# Patient Record
Sex: Female | Born: 1975 | Hispanic: No | Marital: Married | Smoking: Never smoker
Health system: Southern US, Community
[De-identification: ages and names within clinical notes are randomized; demographics above are authoritative.]

## PROBLEM LIST (undated history)

## (undated) DIAGNOSIS — E785 Hyperlipidemia, unspecified: Secondary | ICD-10-CM

## (undated) HISTORY — PX: NO PAST SURGERIES: SHX2092

---

## 1998-05-08 ENCOUNTER — Other Ambulatory Visit: Admission: RE | Admit: 1998-05-08 | Discharge: 1998-05-08 | Payer: Self-pay | Admitting: Obstetrics and Gynecology

## 2016-07-27 ENCOUNTER — Emergency Department (HOSPITAL_COMMUNITY): Payer: BLUE CROSS/BLUE SHIELD

## 2016-07-27 ENCOUNTER — Encounter: Payer: Self-pay | Admitting: Emergency Medicine

## 2016-07-27 ENCOUNTER — Emergency Department (HOSPITAL_COMMUNITY)
Admission: EM | Admit: 2016-07-27 | Discharge: 2016-07-27 | Disposition: A | Discharge status: Home / Self Care | Payer: BLUE CROSS/BLUE SHIELD | Attending: Emergency Medicine | Admitting: Emergency Medicine

## 2016-07-27 DIAGNOSIS — S8992XA Unspecified injury of left lower leg, initial encounter: Secondary | ICD-10-CM | POA: Diagnosis not present

## 2016-07-27 DIAGNOSIS — R079 Chest pain, unspecified: Secondary | ICD-10-CM | POA: Diagnosis not present

## 2016-07-27 DIAGNOSIS — Y9389 Activity, other specified: Secondary | ICD-10-CM | POA: Diagnosis not present

## 2016-07-27 DIAGNOSIS — Y999 Unspecified external cause status: Secondary | ICD-10-CM | POA: Insufficient documentation

## 2016-07-27 DIAGNOSIS — T148XXA Other injury of unspecified body region, initial encounter: Secondary | ICD-10-CM | POA: Diagnosis not present

## 2016-07-27 DIAGNOSIS — M25572 Pain in left ankle and joints of left foot: Secondary | ICD-10-CM | POA: Diagnosis not present

## 2016-07-27 DIAGNOSIS — Y92410 Unspecified street and highway as the place of occurrence of the external cause: Secondary | ICD-10-CM | POA: Insufficient documentation

## 2016-07-27 DIAGNOSIS — M25562 Pain in left knee: Secondary | ICD-10-CM | POA: Insufficient documentation

## 2016-07-27 DIAGNOSIS — S99912A Unspecified injury of left ankle, initial encounter: Secondary | ICD-10-CM | POA: Diagnosis not present

## 2016-07-27 MED ORDER — CYCLOBENZAPRINE HCL 10 MG PO TABS: 10.0000 mg | ORAL_TABLET | Freq: Three times a day (TID) | ORAL | 0 refills | Status: DC | PRN

## 2016-07-27 MED ORDER — IBUPROFEN 800 MG PO TABS: 800.0000 mg | ORAL_TABLET | Freq: Three times a day (TID) | ORAL | 0 refills | Status: DC

## 2016-07-27 MED ORDER — IBUPROFEN 800 MG PO TABS
800.0000 mg | ORAL_TABLET | Freq: Once | ORAL | Status: AC
  Administered 2016-07-27: 800 mg via ORAL
  Filled 2016-07-27: qty 1
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
## 2016-07-27 NOTE — Discharge Instructions (Signed)
Elevate and apply ice packs on/off to your ankle and knee.  Minimal walking or standing for few days.  Call the orthopedic doctor listed to arrange a follow-up appt in one week if not improving

## 2016-07-28 DIAGNOSIS — Z1231 Encounter for screening mammogram for malignant neoplasm of breast: Secondary | ICD-10-CM | POA: Diagnosis not present

## 2016-07-28 DIAGNOSIS — Z6829 Body mass index (BMI) 29.0-29.9, adult: Secondary | ICD-10-CM | POA: Diagnosis not present

## 2016-07-28 DIAGNOSIS — Z01419 Encounter for gynecological examination (general) (routine) without abnormal findings: Secondary | ICD-10-CM | POA: Diagnosis not present

## 2016-08-07 DIAGNOSIS — Z0000 Encounter for general adult medical examination without abnormal findings: Secondary | ICD-10-CM | POA: Diagnosis not present

## 2016-08-07 DIAGNOSIS — Z1389 Encounter for screening for other disorder: Secondary | ICD-10-CM | POA: Diagnosis not present

## 2016-08-07 DIAGNOSIS — Z130 Encounter for screening for diseases of the blood and blood-forming organs and certain disorders involving the immune mechanism: Secondary | ICD-10-CM | POA: Diagnosis not present

## 2016-08-07 DIAGNOSIS — Z1329 Encounter for screening for other suspected endocrine disorder: Secondary | ICD-10-CM | POA: Diagnosis not present

## 2016-08-07 DIAGNOSIS — Z13220 Encounter for screening for lipoid disorders: Secondary | ICD-10-CM | POA: Diagnosis not present

## 2016-08-07 DIAGNOSIS — N911 Secondary amenorrhea: Secondary | ICD-10-CM | POA: Diagnosis not present

## 2017-08-09 DIAGNOSIS — Z1151 Encounter for screening for human papillomavirus (HPV): Secondary | ICD-10-CM | POA: Diagnosis not present

## 2017-08-09 DIAGNOSIS — Z1231 Encounter for screening mammogram for malignant neoplasm of breast: Secondary | ICD-10-CM | POA: Diagnosis not present

## 2017-08-09 DIAGNOSIS — Z01419 Encounter for gynecological examination (general) (routine) without abnormal findings: Secondary | ICD-10-CM | POA: Diagnosis not present

## 2017-08-09 DIAGNOSIS — Z6828 Body mass index (BMI) 28.0-28.9, adult: Secondary | ICD-10-CM | POA: Diagnosis not present

## 2017-08-27 DIAGNOSIS — Z1389 Encounter for screening for other disorder: Secondary | ICD-10-CM | POA: Diagnosis not present

## 2017-08-27 DIAGNOSIS — Z0000 Encounter for general adult medical examination without abnormal findings: Secondary | ICD-10-CM | POA: Diagnosis not present

## 2017-08-27 DIAGNOSIS — Z130 Encounter for screening for diseases of the blood and blood-forming organs and certain disorders involving the immune mechanism: Secondary | ICD-10-CM | POA: Diagnosis not present

## 2017-08-27 DIAGNOSIS — Z13220 Encounter for screening for lipoid disorders: Secondary | ICD-10-CM | POA: Diagnosis not present

## 2017-11-05 DIAGNOSIS — L218 Other seborrheic dermatitis: Secondary | ICD-10-CM | POA: Diagnosis not present

## 2018-09-13 DIAGNOSIS — Z3041 Encounter for surveillance of contraceptive pills: Secondary | ICD-10-CM | POA: Diagnosis not present

## 2018-09-13 DIAGNOSIS — Z1151 Encounter for screening for human papillomavirus (HPV): Secondary | ICD-10-CM | POA: Diagnosis not present

## 2018-09-13 DIAGNOSIS — Z01419 Encounter for gynecological examination (general) (routine) without abnormal findings: Secondary | ICD-10-CM | POA: Diagnosis not present

## 2018-09-13 DIAGNOSIS — Z1231 Encounter for screening mammogram for malignant neoplasm of breast: Secondary | ICD-10-CM | POA: Diagnosis not present

## 2018-09-13 DIAGNOSIS — Z6829 Body mass index (BMI) 29.0-29.9, adult: Secondary | ICD-10-CM | POA: Diagnosis not present

## 2018-09-20 DIAGNOSIS — E559 Vitamin D deficiency, unspecified: Secondary | ICD-10-CM | POA: Diagnosis not present

## 2018-09-20 DIAGNOSIS — Z130 Encounter for screening for diseases of the blood and blood-forming organs and certain disorders involving the immune mechanism: Secondary | ICD-10-CM | POA: Diagnosis not present

## 2018-09-20 DIAGNOSIS — Z13220 Encounter for screening for lipoid disorders: Secondary | ICD-10-CM | POA: Diagnosis not present

## 2018-09-20 DIAGNOSIS — Z1329 Encounter for screening for other suspected endocrine disorder: Secondary | ICD-10-CM | POA: Diagnosis not present

## 2018-09-20 DIAGNOSIS — Z0000 Encounter for general adult medical examination without abnormal findings: Secondary | ICD-10-CM | POA: Diagnosis not present

## 2018-09-23 DIAGNOSIS — Z30432 Encounter for removal of intrauterine contraceptive device: Secondary | ICD-10-CM | POA: Diagnosis not present

## 2018-12-23 DIAGNOSIS — D649 Anemia, unspecified: Secondary | ICD-10-CM | POA: Diagnosis not present

## 2019-03-03 DIAGNOSIS — L818 Other specified disorders of pigmentation: Secondary | ICD-10-CM | POA: Diagnosis not present

## 2019-04-18 DIAGNOSIS — L219 Seborrheic dermatitis, unspecified: Secondary | ICD-10-CM | POA: Diagnosis not present

## 2019-04-18 DIAGNOSIS — L811 Chloasma: Secondary | ICD-10-CM | POA: Diagnosis not present

## 2019-06-23 ENCOUNTER — Ambulatory Visit: Payer: Self-pay | Attending: Internal Medicine

## 2019-06-25 ENCOUNTER — Ambulatory Visit: Payer: Self-pay | Attending: Internal Medicine

## 2019-06-25 DIAGNOSIS — Z23 Encounter for immunization: Secondary | ICD-10-CM

## 2019-06-25 NOTE — Progress Notes (Signed)
   Covid-19 Vaccination Clinic  Name:  Kelly Humphrey    MRN: [id number] DOB: 08/30/1975  06/25/2019  Kelly Humphrey was observed post Covid-19 immunization for 15 minutes without incident. She was provided with Vaccine Information Sheet and instruction to access the V-Safe system.   Kelly Humphrey was instructed to call 911 with any severe reactions post vaccine: Marland Kitchen Difficulty breathing  . Swelling of face and throat  . A fast heartbeat  . A bad rash all over body  . Dizziness and weakness   Immunizations Administered    Name Date Dose VIS Date Route   Pfizer COVID-19 Vaccine 06/25/2019  6:00 PM 0.3 mL 03/10/2019 Intramuscular   Manufacturer: [place]   Lot: [id number]   [place]: [id number]

## 2019-07-06 DIAGNOSIS — L219 Seborrheic dermatitis, unspecified: Secondary | ICD-10-CM | POA: Diagnosis not present

## 2019-07-06 DIAGNOSIS — L811 Chloasma: Secondary | ICD-10-CM | POA: Diagnosis not present

## 2019-07-16 ENCOUNTER — Ambulatory Visit: Payer: Self-pay | Attending: Internal Medicine

## 2019-07-16 DIAGNOSIS — Z23 Encounter for immunization: Secondary | ICD-10-CM

## 2019-07-16 NOTE — Progress Notes (Signed)
   Covid-19 Vaccination Clinic  Name:  Kelly Humphrey    MRN: [id number] DOB: Jan 16, 1976  07/16/2019  [name] was observed post Covid-19 immunization for 15 minutes without incident. She was provided with Vaccine Information Sheet and instruction to access the V-Safe system.   [name] was instructed to call 911 with any severe reactions post vaccine: Marland Kitchen Difficulty breathing  . Swelling of face and throat  . A fast heartbeat  . A bad rash all over body  . Dizziness and weakness   Immunizations Administered    Name Date Dose VIS Date Route   Pfizer COVID-19 Vaccine 07/16/2019  5:11 PM 0.3 mL 03/10/2019 Intramuscular   Manufacturer: [place]   Lot: [id number]   [place]: [id number]

## 2019-09-19 DIAGNOSIS — Z1231 Encounter for screening mammogram for malignant neoplasm of breast: Secondary | ICD-10-CM | POA: Diagnosis not present

## 2019-09-19 DIAGNOSIS — Z01419 Encounter for gynecological examination (general) (routine) without abnormal findings: Secondary | ICD-10-CM | POA: Diagnosis not present

## 2019-09-19 DIAGNOSIS — Z6829 Body mass index (BMI) 29.0-29.9, adult: Secondary | ICD-10-CM | POA: Diagnosis not present

## 2019-10-26 DIAGNOSIS — N764 Abscess of vulva: Secondary | ICD-10-CM | POA: Diagnosis not present

## 2020-04-04 DIAGNOSIS — L811 Chloasma: Secondary | ICD-10-CM | POA: Diagnosis not present

## 2020-04-04 DIAGNOSIS — L218 Other seborrheic dermatitis: Secondary | ICD-10-CM | POA: Diagnosis not present

## 2020-07-02 ENCOUNTER — Other Ambulatory Visit: Payer: Self-pay

## 2020-07-02 ENCOUNTER — Other Ambulatory Visit (HOSPITAL_COMMUNITY)
Admission: RE | Admit: 2020-07-02 | Discharge: 2020-07-02 | Disposition: A | Discharge status: Home / Self Care | Payer: PRIVATE HEALTH INSURANCE | Source: Ambulatory Visit | Attending: Obstetrics | Admitting: Obstetrics

## 2020-07-02 ENCOUNTER — Encounter: Payer: Self-pay | Admitting: Obstetrics

## 2020-07-02 ENCOUNTER — Ambulatory Visit (INDEPENDENT_AMBULATORY_CARE_PROVIDER_SITE_OTHER): Payer: No Typology Code available for payment source | Admitting: Obstetrics

## 2020-07-02 VITALS — BP 122/74 | Ht <= 58 in | Wt 141.0 lb

## 2020-07-02 DIAGNOSIS — Z1231 Encounter for screening mammogram for malignant neoplasm of breast: Secondary | ICD-10-CM

## 2020-07-02 DIAGNOSIS — Z01419 Encounter for gynecological examination (general) (routine) without abnormal findings: Secondary | ICD-10-CM

## 2020-07-02 DIAGNOSIS — Z124 Encounter for screening for malignant neoplasm of cervix: Secondary | ICD-10-CM | POA: Diagnosis present

## 2020-07-02 DIAGNOSIS — Z0000 Encounter for general adult medical examination without abnormal findings: Secondary | ICD-10-CM | POA: Diagnosis not present

## 2020-07-02 NOTE — Progress Notes (Signed)
Gynecology Annual Exam  PCP: Patient, No Pcp Per (Inactive)  Chief Complaint:  Chief Complaint  Patient presents with  . Annual Exam    History of Present Illness: Patient is a 45 y.o. [id number] presents for annual exam. The patient has no complaints today.   LMP: No LMP recorded. Patient is postmenopausal.  The patient is sexually active. She currently uses post menopausal status for contraception. She denies dyspareunia.  The patient does perform self breast exams.  There is notable family history of breast or ovarian cancer in her family.One sister was diagnosed at age 54, and another sister has brain cancer. The patient wears seatbelts: yes.   The patient has regular exercise: yes.    The patient denies current symptoms of depression.    Review of Systems: Review of Systems  Constitutional: Negative.   HENT: Negative.   Eyes: Negative.   Respiratory: Negative.   Cardiovascular: Negative.   Gastrointestinal: Negative.   Genitourinary: Negative.   Musculoskeletal: Positive for joint pain.  Skin: Negative.   Neurological: Negative.   Endo/Heme/Allergies: Negative.   Psychiatric/Behavioral: Negative.     Past Medical History:  There are no problems to display for this patient.   Past Surgical History:  Past Surgical History:  Procedure Laterality Date  . NO PAST SURGERIES      Gynecologic History:  No LMP recorded. Patient is postmenopausal. Contraception: post menopausal status Last Pap: Results were: NILM no abnormalities  Last mammogram: 2021 Results were: BI-RAD I  Obstetric History: [id number]  Family History:  Family History  Problem Relation Age of Onset  . Breast cancer Sister     Social History:  Social History   Socioeconomic History  . Marital status: Unknown    Spouse name: Not on file  . Number of children: Not on file  . Years of education: Not on file  . Highest education level: Not on file  Occupational History  . Not on file   Tobacco Use  . Smoking status: Never Smoker  . Smokeless tobacco: Never Used  Substance and Sexual Activity  . Alcohol use: No  . Drug use: No  . Sexual activity: Yes    Birth control/protection: Post-menopausal  Other Topics Concern  . Not on file  Social History Narrative  . Not on file   Social Determinants of Health   Financial Resource Strain: Not on file  Food Insecurity: Not on file  Transportation Needs: Not on file  Physical Activity: Not on file  Stress: Not on file  Social Connections: Not on file  Intimate Partner Violence: Not on file    Allergies:  No Known Allergies  Medications: Prior to Admission medications   Medication Sig Start Date End Date Taking? Authorizing Provider  cyclobenzaprine (FLEXERIL) 10 MG tablet Take 1 tablet (10 mg total) by mouth 3 (three) times daily as needed. Patient not taking: Reported on 07/02/2020 07/27/16   Triplett, Tammy, PA-C  ibuprofen (ADVIL,MOTRIN) 800 MG tablet Take 1 tablet (800 mg total) by mouth 3 (three) times daily. Patient not taking: Reported on 07/02/2020 07/27/16   Kem Parkinson, PA-C    Physical Exam Vitals: Blood pressure 122/74, height 4\' 10"  (1.473 m), weight 141 lb (64 kg).  General: NAD HEENT: normocephalic, anicteric Thyroid: no enlargement, no palpable nodules Pulmonary: No increased work of breathing, CTAB Cardiovascular: RRR, distal pulses 2+ Breast: Breast symmetrical, no tenderness, no palpable nodules or masses, no skin or nipple retraction present, no nipple discharge.  No axillary or  supraclavicular lymphadenopathy. Abdomen: NABS, soft, non-tender, non-distended.  Umbilicus without lesions.  No hepatomegaly, splenomegaly or masses palpable. No evidence of hernia  Genitourinary:  External: Normal external female genitalia.  Normal urethral meatus, normal Bartholin's and Skene's glands.    Vagina: Normal vaginal mucosa, no evidence of prolapse.    Cervix: Grossly normal in appearance, no  bleeding  Uterus: Non-enlarged, mobile, normal contour.  No CMT  Adnexa: ovaries non-enlarged, no adnexal masses  Rectal: deferred  Lymphatic: no evidence of inguinal lymphadenopathy Extremities: no edema, erythema, or tenderness Neurologic: Grossly intact Psychiatric: mood appropriate, affect full  Female chaperone present for pelvic and breast  portions of the physical exam    Assessment: 44 y.o. G2P2002 routine annual exam  Plan: Problem List Items Addressed This Visit   None   Visit Diagnoses    Women's annual routine gynecological examination    -  Primary   Relevant Orders   Comprehensive metabolic panel   CBC With Differential   VITAMIN D 25 Hydroxy (Vit-D Deficiency, Fractures)   Cytology - PAP   Cervical cancer screening       Relevant Orders   Cytology - PAP   Health maintenance examination       Relevant Orders   Comprehensive metabolic panel   CBC With Differential   VITAMIN D 25 Hydroxy (Vit-D Deficiency, Fractures)      1) Mammogram - recommend yearly screening mammogram.  Mammogram Was ordered today   2) STI screening  wasoffered and declined  3) ASCCP guidelines and rational discussed.  Patient opts for yearly screening interval  4) Contraception - the patient is currently using  post menopausal status.  She is not currently in need of contraception secondary to being sterile  5) Colonoscopy -- Screening recommended starting at age 15 for average risk individuals, age 65 for individuals deemed at increased risk (including African Americans) and recommended to continue until age 52.  For patient age 50-85 individualized approach is recommended.  Gold standard screening is via colonoscopy, Cologuard screening is an acceptable alternative for patient unwilling or unable to undergo colonoscopy.  "Colorectal cancer screening for average?risk adults: 2018 guideline update from the American Cancer Society"CA: A Cancer Journal for Clinicians: Aug 26, 2016   6)  Routine healthcare maintenance including cholesterol, diabetes screening discussed Ordered today CMP drawn, CBC and Vitamin D per her request for health maintenance  7) No follow-ups on file.   Imagene Riches, CNM  07/02/2020 4:55 PM   [facility] 07/02/2020, 4:47 PM

## 2020-07-03 ENCOUNTER — Other Ambulatory Visit: Payer: Self-pay | Admitting: Obstetrics

## 2020-07-03 ENCOUNTER — Encounter: Payer: Self-pay | Admitting: Obstetrics

## 2020-07-03 DIAGNOSIS — E559 Vitamin D deficiency, unspecified: Secondary | ICD-10-CM

## 2020-07-03 LAB — CBC WITH DIFFERENTIAL
Basophils Absolute: 0 10*3/uL (ref 0.0–0.2)
Basos: 0 %
EOS (ABSOLUTE): 0.1 10*3/uL (ref 0.0–0.4)
Eos: 1 %
Hematocrit: 37.2 % (ref 34.0–46.6)
Hemoglobin: 11.8 g/dL (ref 11.1–15.9)
Immature Grans (Abs): 0 10*3/uL (ref 0.0–0.1)
Immature Granulocytes: 0 %
Lymphocytes Absolute: 3.2 10*3/uL — ABNORMAL HIGH (ref 0.7–3.1)
Lymphs: 41 %
MCH: 25.5 pg — ABNORMAL LOW (ref 26.6–33.0)
MCHC: 31.7 g/dL (ref 31.5–35.7)
MCV: 81 fL (ref 79–97)
Monocytes Absolute: 0.5 10*3/uL (ref 0.1–0.9)
Monocytes: 7 %
Neutrophils Absolute: 3.9 10*3/uL (ref 1.4–7.0)
Neutrophils: 51 %
RBC: 4.62 x10E6/uL (ref 3.77–5.28)
RDW: 14.5 % (ref 11.7–15.4)
WBC: 7.8 10*3/uL (ref 3.4–10.8)

## 2020-07-03 LAB — COMPREHENSIVE METABOLIC PANEL
ALT: 20 IU/L (ref 0–32)
AST: 23 IU/L (ref 0–40)
Albumin/Globulin Ratio: 1.6 (ref 1.2–2.2)
Albumin: 4.5 g/dL (ref 3.8–4.8)
Alkaline Phosphatase: 101 IU/L (ref 44–121)
BUN/Creatinine Ratio: 15 (ref 9–23)
BUN: 12 mg/dL (ref 6–24)
Bilirubin Total: <0.2 mg/dL (ref 0.0–1.2)
CO2: 23 mmol/L (ref 20–29)
Calcium: 9.4 mg/dL (ref 8.7–10.2)
Chloride: 106 mmol/L (ref 96–106)
Creatinine, Ser: 0.81 mg/dL (ref 0.57–1.00)
Globulin, Total: 2.8 g/dL (ref 1.5–4.5)
Glucose: 89 mg/dL (ref 65–99)
Potassium: 4.6 mmol/L (ref 3.5–5.2)
Sodium: 144 mmol/L (ref 134–144)
Total Protein: 7.3 g/dL (ref 6.0–8.5)
eGFR: 91 mL/min/{1.73_m2} (ref >59)

## 2020-07-03 LAB — VITAMIN D 25 HYDROXY (VIT D DEFICIENCY, FRACTURES): Vit D, 25-Hydroxy: 25.6 ng/mL — ABNORMAL LOW (ref 30.0–100.0)

## 2020-07-03 MED ORDER — VITAMIN D (ERGOCALCIFEROL) 1.25 MG (50000 UNIT) PO CAPS: 50000.0000 [IU] | ORAL_CAPSULE | ORAL | 1 refills | Status: DC

## 2020-07-03 NOTE — Progress Notes (Unsigned)
Kelly Melburn Hake' Vitamin D level result is 26.5. a Rx for Vitamin D 50000 units is sent to the pharmacy. She is to take one capsule every 7 days. Patient is also notified that she should begin this prescription, and has the option of returning f or another lab draw to recheck her levels  PRN.  Imagene Riches, CNM  07/03/2020 9:54 PM

## 2020-07-04 LAB — CYTOLOGY - PAP
Comment: NEGATIVE
Diagnosis: NEGATIVE
High risk HPV: NEGATIVE

## 2020-07-22 ENCOUNTER — Other Ambulatory Visit: Payer: Self-pay | Admitting: Obstetrics

## 2020-07-22 DIAGNOSIS — Z1231 Encounter for screening mammogram for malignant neoplasm of breast: Secondary | ICD-10-CM

## 2020-08-01 ENCOUNTER — Other Ambulatory Visit: Payer: Self-pay

## 2020-08-01 ENCOUNTER — Ambulatory Visit
Admission: RE | Admit: 2020-08-01 | Discharge: 2020-08-01 | Disposition: A | Discharge status: Home / Self Care | Payer: No Typology Code available for payment source | Source: Ambulatory Visit | Attending: Obstetrics | Admitting: Obstetrics

## 2020-08-01 DIAGNOSIS — Z1231 Encounter for screening mammogram for malignant neoplasm of breast: Secondary | ICD-10-CM | POA: Insufficient documentation

## 2021-01-30 ENCOUNTER — Ambulatory Visit: Payer: No Typology Code available for payment source | Admitting: Internal Medicine

## 2021-01-30 ENCOUNTER — Other Ambulatory Visit: Payer: Self-pay

## 2021-01-30 ENCOUNTER — Encounter: Payer: Self-pay | Admitting: Internal Medicine

## 2021-01-30 VITALS — BP 104/65 | HR 86 | Temp 97.7°F | Resp 17 | Ht <= 58 in | Wt 140.6 lb

## 2021-01-30 DIAGNOSIS — E6609 Other obesity due to excess calories: Secondary | ICD-10-CM | POA: Diagnosis not present

## 2021-01-30 DIAGNOSIS — E663 Overweight: Secondary | ICD-10-CM | POA: Insufficient documentation

## 2021-01-30 DIAGNOSIS — H7292 Unspecified perforation of tympanic membrane, left ear: Secondary | ICD-10-CM

## 2021-01-30 DIAGNOSIS — Z6826 Body mass index (BMI) 26.0-26.9, adult: Secondary | ICD-10-CM | POA: Insufficient documentation

## 2021-01-30 DIAGNOSIS — Z23 Encounter for immunization: Secondary | ICD-10-CM

## 2021-01-30 DIAGNOSIS — Z6830 Body mass index (BMI) 30.0-30.9, adult: Secondary | ICD-10-CM

## 2021-01-30 DIAGNOSIS — D1720 Benign lipomatous neoplasm of skin and subcutaneous tissue of unspecified limb: Secondary | ICD-10-CM | POA: Diagnosis not present

## 2021-01-30 NOTE — Assessment & Plan Note (Signed)
Bilateral No intervention needed at this time as they are not enlarging or painful

## 2021-01-30 NOTE — Patient Instructions (Signed)
Eardrum Rupture, Adult An eardrum rupture is a hole (perforation) in the eardrum. The eardrum is a thin, round tissue inside of the ear that separates the ear canal from the middle ear. The eardrum is also called the tympanic membrane. It transfers sound vibrations through small bones in the middle ear to the hearing nerve in the inner ear. It also protects the middle ear from germs. An eardrum rupture can cause pain and hearing loss. What are the causes? This condition may be caused by: An infection. A sudden injury, such as from: Inserting a thin, sharp object into the ear. A hit to the side of the head, especially by an open hand. Falling onto water or a flat surface. A rapid change in pressure, such as from flying or scuba diving. A sudden increase in pressure against the eardrum, such as from an explosion or a very loud noise. Inserting a cotton-tipped swab in the ear. A long-term eustachian tube disorder. Eustachian tubes are parts of the body that connect each middle ear space to the back of the nose. A medical procedure or surgery, such as a procedure to remove wax from the ear canal. Removing a pressure equalization tube(PE tube) that was surgically placed through the eardrum. Having a PE tube fall out. What increases the risk? You are more likely to develop this condition if: You have had PE tubes inserted in your ears. You have an ear infection. You play sports that: Involve balls or contact with other players. Take place in water, such as diving, scuba diving, or waterskiing. What are the signs or symptoms? Symptoms of this condition include: Sudden pain at the time of the injury. Ear pain that suddenly improves. Ringing in the ear after the injury. Drainage from the ear. The drainage may be clear, cloudy or pus-like, or bloody. Hearing loss. Dizziness. How is this diagnosed? This condition is diagnosed based on your symptoms and medical history as well as a physical exam.  Your health care provider can usually see a perforation using an ear scope (otoscope). You may have tests, such as: A hearing test (audiogram) to check for hearing loss. A test in which a sample of ear drainage is tested for infection (culture). How is this treated? An eardrum typically heals on its own within a few weeks. If your eardrum does not heal, your health care provider may recommend a procedure to place a patch over your eardrum or surgery to repair your eardrum. Your health care provider may also prescribe antibiotic medicines to help prevent infection. If the ear heals completely, any hearing loss should be temporary. Follow these instructions at home: Medicines Take over-the-counter and prescription medicines only as told by your health care provider. If you were prescribed an antibiotic medicine, use it as told by your health care provider. Do not stop using the antibiotic even if you start to feel better. Ear care Keep your ear dry. This is very important. Follow instructions from your health care provider about how to keep your ear dry. You may need to wear waterproof earplugs when bathing and swimming. If directed, apply heat to your affected ear as often as told by your health care provider. Use the heat source that your health care provider recommends, such as a moist heat pack or a heating pad. This will help to relieve pain. Place a towel between your skin and the heat source. Leave the heat on for 20-30 minutes. Remove the heat if your skin turns bright red. This  is especially important if you are unable to feel pain, heat, or cold. You have a greater risk of getting burned. General instructions Return to sports and activities as told by your health care provider. Ask your health care provider what activities are safe for you. Wear headgear with ear protection when you play sports in which ear injuries are common. Talk to your health care provider before traveling by  plane. Keep all follow-up visits. This is important. Contact a health care provider if: You have a fever. You have ear pain. You have mucus or blood draining from your ear. You have hearing loss, dizziness, or ringing in your ear. Get help right away if: You have sudden hearing loss. You are very dizzy. You have severe ear pain. Your face feels weak or becomes limp (paralyzed). These symptoms may represent a serious problem that is an emergency. Do not wait to see if the symptoms will go away. Get medical help right away. Call your local emergency services (911 in the [place] Do not drive yourself to the hospital. Summary An eardrum rupture is a hole (perforation) in the eardrum that can cause pain and hearing loss. It is usually caused by a sudden injury to the ear. The eardrum will likely heal on its own within a few weeks. In some cases, surgery may be necessary. Follow instructions from your health care provider about how to keep your ear dry as it heals. This information is not intended to replace advice given to you by your health care provider. Make sure you discuss any questions you have with your health care provider. Document Revised: 02/05/2020 Document Reviewed: 02/05/2020 Elsevier Patient Education  2022 [employer].

## 2021-01-30 NOTE — Progress Notes (Signed)
HPI  Patient presents the clinic today to establish care.  She is transferring her care from [place] [facility].  She reports left ear fullness and decreased hearing.  She reports this started 1 month ago.  She reports having multiple ear infections as a child and recalls having a ruptured eardrum.  She denies drainage from the ear.  She denies headache, runny nose, nasal congestion, sore throat or cough.  She has not tried anything OTC for this.  She also reports lump in both of her axillas for years.  These lumps have not gotten bigger in size.  They do not hurt.  She has not noticed any redness, warmth or drainage from these areas.  She has not tried anything OTC for this.  History reviewed. No pertinent past medical history.  Current Outpatient Medications  Medication Sig Dispense Refill   Multiple Vitamin (MULTIVITAMIN) tablet Take 1 tablet by mouth daily.     No current facility-administered medications for this visit.    No Known Allergies  Family History  Problem Relation Age of Onset   Breast cancer Sister     Social History   Socioeconomic History   Marital status: Unknown    Spouse name: Not on file   Number of children: Not on file   Years of education: Not on file   Highest education level: Not on file  Occupational History   Not on file  Tobacco Use   Smoking status: Never   Smokeless tobacco: Never  Vaping Use   Vaping Use: Never used  Substance and Sexual Activity   Alcohol use: No   Drug use: No   Sexual activity: Yes    Birth control/protection: Post-menopausal  Other Topics Concern   Not on file  Social History Narrative   Not on file   Social Determinants of Health   Financial Resource Strain: Not on file  Food Insecurity: Not on file  Transportation Needs: Not on file  Physical Activity: Not on file  Stress: Not on file  Social Connections: Not on file  Intimate Partner Violence: Not on file    ROS:  Constitutional: Denies fever, malaise,  fatigue, headache or abrupt weight changes.  HEENT: Patient reports left ear fullness and decreased hearing.  Denies eye pain, eye redness, ear pain, ringing in the ears, wax buildup, runny nose, nasal congestion, bloody nose, or sore throat. Respiratory: Denies difficulty breathing, shortness of breath, cough or sputum production.   Cardiovascular: Denies chest pain, chest tightness, palpitations or swelling in the hands or feet.  Gastrointestinal: Denies abdominal pain, bloating, constipation, diarrhea or blood in the stool.  GU: Denies frequency, urgency, pain with urination, blood in urine, odor or discharge. Musculoskeletal: Denies decrease in range of motion, difficulty with gait, muscle pain or joint pain and swelling.  Skin: Patient reports lump of bilateral axillas.  Denies redness, rashes, lesions or ulcercations.  Neurological: Denies dizziness, difficulty with memory, difficulty with speech or problems with balance and coordination.  Psych: Denies anxiety, depression, SI/HI.  No other specific complaints in a complete review of systems (except as listed in HPI above).  PE:  BP 104/65 (BP Location: Right Arm, Patient Position: Sitting, Cuff Size: Normal)   Pulse 86   Temp 97.7 F (36.5 C) (Temporal)   Resp 17   Ht 4\' 9"  (1.448 m)   Wt 140 lb 9.6 oz (63.8 kg)   SpO2 99%   BMI 30.43 kg/m  Wt Readings from Last 3 Encounters:  01/30/21 140 lb  9.6 oz (63.8 kg)  07/02/20 141 lb (64 kg)  07/27/16 138 lb (62.6 kg)    General: Appears her stated age, obese, in NAD. Skin: Lipomas noted of bilateral axillas. HEENT: Head: normal shape and size; Eyes: EOMs intact; Left Ear: Nonhealing ruptured TM Cardiovascular: Normal rate and rhythm. S1,S2 noted.  No murmur, rubs or gallops noted.  Pulmonary/Chest: Normal effort and positive vesicular breath sounds. No respiratory distress. No wheezes, rales or ronchi noted.  Musculoskeletal: No difficulty with gait.  Neurological: Alert and  oriented.  Psychiatric: Mood and affect normal. Behavior is normal. Judgment and thought content normal.    BMET    Component Value Date/Time   NA 144 07/02/2020 1625   K 4.6 07/02/2020 1625   CL 106 07/02/2020 1625   CO2 23 07/02/2020 1625   GLUCOSE 89 07/02/2020 1625   BUN 12 07/02/2020 1625   CREATININE 0.81 07/02/2020 1625   CALCIUM 9.4 07/02/2020 1625    Lipid Panel  No results found for: CHOL, TRIG, HDL, CHOLHDL, VLDL, LDLCALC  CBC    Component Value Date/Time   WBC 7.8 07/02/2020 1625   RBC 4.62 07/02/2020 1625   HGB 11.8 07/02/2020 1625   HCT 37.2 07/02/2020 1625   MCV 81 07/02/2020 1625   MCH 25.5 (L) 07/02/2020 1625   MCHC 31.7 07/02/2020 1625   RDW 14.5 07/02/2020 1625   LYMPHSABS 3.2 (H) 07/02/2020 1625   EOSABS 0.1 07/02/2020 1625   BASOSABS 0.0 07/02/2020 1625    Hgb A1C No results found for: HGBA1C   Assessment and Plan: ' Ruptured TM:  She has been referred to ENT and will follow up with them in January  RTC in 1 year for your annual exam.  Webb Silversmith, NP This visit occurred during the SARS-CoV-2 public health emergency.  Safety protocols were in place, including screening questions prior to the visit, additional usage of staff PPE, and extensive cleaning of exam room while observing appropriate contact time as indicated for disinfecting solutions.

## 2021-01-30 NOTE — Assessment & Plan Note (Signed)
Encouraged diet and exercise for weight loss ?

## 2021-09-03 ENCOUNTER — Other Ambulatory Visit: Payer: Self-pay | Admitting: Internal Medicine

## 2021-09-10 ENCOUNTER — Other Ambulatory Visit (HOSPITAL_COMMUNITY): Payer: Self-pay | Admitting: Internal Medicine

## 2021-09-10 DIAGNOSIS — Z1231 Encounter for screening mammogram for malignant neoplasm of breast: Secondary | ICD-10-CM

## 2021-09-12 ENCOUNTER — Ambulatory Visit (HOSPITAL_COMMUNITY)
Admission: RE | Admit: 2021-09-12 | Discharge: 2021-09-12 | Disposition: A | Discharge status: Home / Self Care | Payer: BC Managed Care – PPO | Source: Ambulatory Visit | Attending: Internal Medicine | Admitting: Internal Medicine

## 2021-09-12 DIAGNOSIS — Z1231 Encounter for screening mammogram for malignant neoplasm of breast: Secondary | ICD-10-CM | POA: Insufficient documentation

## 2021-12-26 ENCOUNTER — Emergency Department: Payer: BC Managed Care – PPO

## 2021-12-26 ENCOUNTER — Other Ambulatory Visit: Payer: Self-pay

## 2021-12-26 ENCOUNTER — Emergency Department
Admission: EM | Admit: 2021-12-26 | Discharge: 2021-12-26 | Disposition: A | Discharge status: Home / Self Care | Payer: BC Managed Care – PPO | Attending: Emergency Medicine | Admitting: Emergency Medicine

## 2021-12-26 DIAGNOSIS — D649 Anemia, unspecified: Secondary | ICD-10-CM | POA: Diagnosis not present

## 2021-12-26 DIAGNOSIS — R0789 Other chest pain: Secondary | ICD-10-CM | POA: Insufficient documentation

## 2021-12-26 DIAGNOSIS — R0602 Shortness of breath: Secondary | ICD-10-CM | POA: Diagnosis not present

## 2021-12-26 LAB — TROPONIN I (HIGH SENSITIVITY): Troponin I (High Sensitivity): 2 ng/L (ref <18)

## 2021-12-26 LAB — BASIC METABOLIC PANEL
Anion gap: 7 (ref 5–15)
BUN: 17 mg/dL (ref 6–20)
CO2: 27 mmol/L (ref 22–32)
Calcium: 9 mg/dL (ref 8.9–10.3)
Chloride: 107 mmol/L (ref 98–111)
Creatinine, Ser: 0.68 mg/dL (ref 0.44–1.00)
GFR, Estimated: >60 mL/min (ref >60)
Glucose, Bld: 91 mg/dL (ref 70–99)
Potassium: 3.4 mmol/L — ABNORMAL LOW (ref 3.5–5.1)
Sodium: 141 mmol/L (ref 135–145)

## 2021-12-26 LAB — CBC
HCT: 38.3 % (ref 36.0–46.0)
Hemoglobin: 11.8 g/dL — ABNORMAL LOW (ref 12.0–15.0)
MCH: 26.1 pg (ref 26.0–34.0)
MCHC: 30.8 g/dL (ref 30.0–36.0)
MCV: 84.7 fL (ref 80.0–100.0)
Platelets: 241 10*3/uL (ref 150–400)
RBC: 4.52 MIL/uL (ref 3.87–5.11)
RDW: 14.2 % (ref 11.5–15.5)
WBC: 8.3 10*3/uL (ref 4.0–10.5)
nRBC: 0 % (ref 0.0–0.2)

## 2021-12-26 NOTE — Discharge Instructions (Addendum)
-  The cardiology office should be contacting you within the next few days.  If they do not, please use the contact information listed in these instructions to schedule an appointment.  -Follow-up with your primary care provider as needed.  -Return to the emergency department anytime if you begin to experience any new or worsening symptoms.

## 2021-12-26 NOTE — ED Notes (Signed)
Pt to ED via [facility] for chest pain and shortness of breath. Pt is in NAD.

## 2021-12-26 NOTE — ED Triage Notes (Signed)
Pt here with cp that felt like palpitations and SOB x3 days ago. Pt denies cardiac hx. Pt states pain is left sided and denies radiation. Pt states pain is intermittent and denies N/V/D.

## 2021-12-26 NOTE — ED Provider Notes (Signed)
[facility] Provider Note    Event Date/Time   First MD Initiated Contact with Patient 12/26/21 1732     (approximate)   History   Chief Complaint Chest Pain and Shortness of Breath   HPI Kelly Humphrey is a 46 y.o. female, history of class I obesity, presents to the emergency department for evaluation of chest pain/shortness of breath x3 days.  She states that she has been feeling intermittent episodes of chest discomfort that last for about 10 to 15 seconds sporadically throughout the day, associated with shortness of breath during the duration.  She cannot identify any particular triggers for it.  Does not appear to be associated with exertion.  She is not currently having any symptoms at this time.  Denies fever/chills, abdominal pain, flank pain, nausea/vomiting, diarrhea, dysuria, headache, vision changes, hearing changes, rash/lesions, or dizziness/lightheadedness.  History Limitations: No limitations.        Physical Exam  Triage Vital Signs: ED Triage Vitals  Enc Vitals Group     BP 12/26/21 1459 121/82     Pulse Rate 12/26/21 1459 76     Resp 12/26/21 1459 18     Temp 12/26/21 1459 98.2 F (36.8 C)     Temp Source 12/26/21 1459 Oral     SpO2 12/26/21 1459 100 %     Weight 12/26/21 1457 140 lb 10.5 oz (63.8 kg)     Height 12/26/21 1457 '4\' 9"'$  (1.448 m)     Head Circumference --      Peak Flow --      Pain Score 12/26/21 1457 3     Pain Loc --      Pain Edu? --      Excl. in [place]? --     Most recent vital signs: Vitals:   12/26/21 1459  BP: 121/82  Pulse: 76  Resp: 18  Temp: 98.2 F (36.8 C)  SpO2: 100%    General: Awake, NAD.  Skin: Warm, dry. No rashes or lesions.  Eyes: PERRL. Conjunctivae normal.  CV: Good peripheral perfusion.  S1 and S2 present.  No murmurs, rubs, or gallops. Resp: Normal effort.  Lung sounds are clear bilaterally. Abd: Soft, non-tender. No distention.  Neuro: At baseline. No gross neurological  deficits.  Musculoskeletal: Normal ROM of all extremities.  Focused Exam: No chest wall tenderness.  Physical Exam    ED Results / Procedures / Treatments  Labs (all labs ordered are listed, but only abnormal results are displayed) Labs Reviewed  BASIC METABOLIC PANEL - Abnormal; Notable for the following components:      Result Value   Potassium 3.4 (*)    All other components within normal limits  CBC - Abnormal; Notable for the following components:   Hemoglobin 11.8 (*)    All other components within normal limits  POC URINE PREG, ED  TROPONIN I (HIGH SENSITIVITY)  TROPONIN I (HIGH SENSITIVITY)     EKG Sinus rhythm, rate of 76, intermittent PACs present.  No ST segment changes.  No axis deviations, normal QRS, no QT prolongation    RADIOLOGY  ED Provider Interpretation: I personally viewed and interpreted this x-ray, no evidence of acute cardiopulmonary abnormalities.  DG Chest 2 View  Result Date: 12/26/2021 CLINICAL DATA:  Shortness of breath and palpitations. EXAM: CHEST - 2 VIEW COMPARISON:  None Available. FINDINGS: The heart size and mediastinal contours are within normal limits. There is no evidence of pulmonary edema, consolidation, pneumothorax, nodule or pleural  fluid. The visualized skeletal structures are unremarkable. IMPRESSION: No active cardiopulmonary disease. Electronically Signed   By: Aletta Edouard M.D.   On: 12/26/2021 15:23    PROCEDURES:  Critical Care performed: N/A.  Procedures    MEDICATIONS ORDERED IN ED: Medications - No data to display   IMPRESSION / MDM / [place] / ED COURSE  I reviewed the triage vital signs and the nursing notes.                              Differential diagnosis includes, but is not limited to, ACS, pulmonary embolism, CHF, gastritis, esophagitis, costochondritis, pneumonia, anxiety   ED Course Patient appears well, vitals within normal limits.  NAD.  CBC shows no leukocytosis.  Mild  anemia present with hemoglobin of 11.8, consistent with previous values.  BMP shows mild hypokalemia at 3.4.  Otherwise no AKI or significant electrolyte abnormalities.  Initial troponin unremarkable at 2.  Assessment/Plan Patient presents with intermittent episodes of chest discomfort/shortness of breath that lasted for approximately 10 to 15 seconds.  These episodes occur sporadically throughout the day.  Physical exam is unremarkable.  EKG shows some PACs, which may be contributing to some of her symptoms.  Troponin is unremarkable.  No signs of ACS or pericarditis/myocarditis.  She is PERC negative, very low suspicion for pulmonary embolism.  Chest x-ray is negative.  Her symptoms are nonexertional.  Unclear etiology at this point, however I suspect that this is likely benign.  Nonetheless, we will provide her with a referral to cardiology for further evaluation.  Patient is amenable with this plan.  Will discharge.  Considered admission for this patient, but given her stable presentation and unremarkable work-up, she is unlikely to benefit from admission.  Provided the patient with anticipatory guidance, return precautions, and educational material. Encouraged the patient to return to the emergency department at any time if they begin to experience any new or worsening symptoms. Patient expressed understanding and agreed with the plan.   Patient's presentation is most consistent with acute complicated illness / injury requiring diagnostic workup.       FINAL CLINICAL IMPRESSION(S) / ED DIAGNOSES   Final diagnoses:  Atypical chest pain     Rx / DC Orders   ED Discharge Orders          Ordered    Ambulatory referral to Cardiology       Comments: If you have not heard from the Cardiology office within the next 72 hours please call [phone].   12/26/21 1747             Note:  This document was prepared using Dragon voice recognition software and may include unintentional  dictation errors.   Teodoro Spray, [place] 12/26/21 Kevan Ny    Nance Pear, MD 12/26/21 [id number]

## 2021-12-29 ENCOUNTER — Telehealth: Payer: Self-pay

## 2021-12-29 NOTE — Telephone Encounter (Signed)
Transition Care Management Unsuccessful Follow-up Telephone Call  Date of discharge and from where:  12/26/21 from [facility] ER   Attempts:  1st Attempt  Reason for unsuccessful TCM follow-up call:  Left voice message

## 2021-12-30 NOTE — Telephone Encounter (Signed)
Transition Care Management Unsuccessful Follow-up Telephone Call  Date of discharge and from where:  12/26/21 from [facility] ER with chest pain.  She has a new pt appointment with cariology on 01/01/22 per chart.   Attempts:  2nd Attempt  Reason for unsuccessful TCM follow-up call:  Left voice message

## 2022-01-01 ENCOUNTER — Encounter: Payer: Self-pay | Admitting: Cardiovascular Disease

## 2022-01-01 ENCOUNTER — Ambulatory Visit: Payer: BC Managed Care – PPO | Attending: Cardiovascular Disease | Admitting: Cardiovascular Disease

## 2022-01-01 VITALS — BP 102/62 | HR 72 | Ht 59.0 in | Wt 133.2 lb

## 2022-01-01 DIAGNOSIS — R079 Chest pain, unspecified: Secondary | ICD-10-CM

## 2022-01-01 DIAGNOSIS — K219 Gastro-esophageal reflux disease without esophagitis: Secondary | ICD-10-CM

## 2022-01-01 MED ORDER — PANTOPRAZOLE SODIUM 40 MG PO TBEC: 40.0000 mg | DELAYED_RELEASE_TABLET | Freq: Every day | ORAL | 0 refills | Status: DC

## 2022-01-01 NOTE — Patient Instructions (Addendum)
Medication Instructions:  Your physician has recommended you make the following change in your medication:   START Protonix 40 mg each morning. An Rx has been sent to your pharmacy.  *If you need a refill on your cardiac medications before your next appointment, please call your pharmacy*   Lab Work: None ordered If you have labs (blood work) drawn today and your tests are completely normal, you will receive your results only by: [place] (if you have MyChart) OR A paper copy in the mail If you have any lab test that is abnormal or we need to change your treatment, we will call you to review the results.   Testing/Procedures: Your physician has requested that you have an exercise tolerance test. For further information please visit [email or web address]. Please also follow instruction sheet, as given.    Follow-Up: At [facility], you and your health needs are our priority.  As part of our continuing mission to provide you with exceptional heart care, we have created designated Provider Care Teams.  These Care Teams include your primary Cardiologist (physician) and Advanced Practice Providers (APPs -  Physician Assistants and Nurse Practitioners) who all work together to provide you with the care you need, when you need it.  We recommend signing up for the patient portal called "MyChart".  Sign up information is provided on this After Visit Summary.  MyChart is used to connect with patients for Virtual Visits (Telemedicine).  Patients are able to view lab/test results, encounter notes, upcoming appointments, etc.  Non-urgent messages can be sent to your provider as well.   To learn more about what you can do with MyChart, go to [email or web address].    Your next appointment:   As needed  The format for your next appointment:   In Person  Provider:   You may see Kathlyn Sacramento, MD or one of the following Advanced Practice Providers on your designated Care Team:    Murray Hodgkins, NP Christell Faith, PA-C Cadence Kathlen Mody, PA-C Gerrie Nordmann, NP   Other Instructions  Pre-test instructions:  - you may eat a light breakfast/ lunch prior to your procedure - no caffeine for 24 hours prior to your test (coffee, tea, soft drinks, or chocolate)  - no smoking/ vaping for 4 hours prior to your test - you may take your regular medications the day of your test except for:  - bring any inhalers with you to your test - wear comfortable clothing & tennis/ non-skid shoes to walk on the treadmill   Enfermedad de reflujo gastroesofgico en los adultos Gastroesophageal Reflux Disease, Adult  El reflujo gastroesofgico (RGE) ocurre cuando el cido del estmago sube por el tubo que conecta la boca con el estmago (esfago). Normalmente, la comida baja por el esfago y se mantiene en el estmago, donde se la digiere. Cuando una persona tiene RGE, los alimentos y el cido estomacal suelen volver al esfago. Usted puede tener una enfermedad llamada enfermedad de reflujo gastroesofgico (ERGE) si el reflujo: Sucede a menudo. Causa sntomas frecuentes o muy intensos. Causa problemas tales como dao en el esfago. Cuando esto ocurre, el esfago duele y se hincha. Con el tiempo, la ERGE puede ocasionar pequeos agujeros (lceras) en el revestimiento del esfago. Cules son las causas? Esta afeccin se debe a un problema en el msculo que se encuentra entre el esfago y [place]. Cuando este msculo est dbil o no es normal, no se cierra correctamente para impedir que los alimentos y el cido regresen del  estmago. El msculo puede debilitarse debido a lo siguiente: El consumo de [place]. [place]. Tener cierto tipo de hernia (hernia de hiato). Consumo de alcohol. Ciertos alimentos y bebidas, como caf, chocolate, cebollas y [place]. Qu incrementa el riesgo? Tener sobrepeso. Tener una enfermedad que afecta el tejido conjuntivo. Tomar antiinflamatorios no esteroideos  (AINE), como el ibuprofeno. Cules son los signos o sntomas? Acidez estomacal. Dificultad o dolor al tragar. Sensacin de Best boy un bulto en la garganta. Sabor amargo en la boca. Mal aliento. Tener una gran cantidad de saliva. Estmago inflamado o con Tree surgeon. Eructos. Dolor en el pecho. El dolor de pecho puede deberse a distintas afecciones. Asegrese de Teacher, adult education a su mdico si tiene Tourist information centre manager. Dificultad para respirar o sibilancias. Ardelia Mems tos a largo plazo o tos nocturna. Desgaste de la superficie de los dientes (esmalte dental). Prdida de peso. Cmo se trata? Realizar cambios en la dieta. Tomar medicamentos. Someterse a [place]. El tratamiento depender de la gravedad de los sntomas. Siga estas instrucciones en su casa: Comida y bebida  Siga una dieta como se lo haya indicado el mdico. Es posible que deba evitar alimentos y bebidas, por ejemplo: Caf y t negro, con o sin cafena. Bebidas que contengan alcohol. Bebidas energticas y deportivas. Bebidas gaseosas (carbonatadas) y refrescos. Chocolate y cacao. Menta y [place]. Ajo y cebolla. Rbano picante. Alimentos cidos y condimentados. Estos incluyen todos los tipos de pimientos, [place] en polvo, curry en polvo, vinagre, salsas picantes y [employer]. Ctricos y sus jugos, por ejemplo, naranjas, limones y limas. Alimentos que [employer]. Estos incluyen salsa roja, [place], salsa picante y pizza con salsa de [place]. Alimentos fritos y Radio broadcast assistant. Estos incluyen donas, papas fritas, papitas fritas de bolsa y aderezos con alto contenido de [place]. Carnes con alto contenido de [place]. Estas incluye los perros calientes, chuletas o costillas, embutidos, jamn y tocino. Productos lcteos ricos en grasas, como leche Esto, [place] y [place] crema. Consuma pequeas cantidades de comida con ms frecuencia. Evite consumir porciones abundantes. Evite beber grandes cantidades de lquidos con las  comidas. Evite comer 2 o 3 horas antes de acostarse. Evite recostarse inmediatamente despus de comer. No haga ejercicios enseguida despus de comer. Estilo de vida  No fume ni consuma ningn producto que contenga nicotina o tabaco. Si necesita ayuda para dejar de consumir estos productos, consulte al mdico. Intente reducir el nivel de estrs. Si necesita ayuda para hacer esto, consulte al mdico. Si tiene sobrepeso, baje una cantidad de peso saludable para usted. Consulte a su mdico para bajar de peso de [employer]. Instrucciones generales Est atento a cualquier cambio en los sntomas. Tome los medicamentos de venta libre y los recetados solamente como se lo haya indicado el mdico. No tome aspirina, ibuprofeno ni otros antiinflamatorios no esteroideos (AINE) a menos que el mdico lo autorice. Use ropa holgada. No use nada apretado alrededor de la cintura. [place]ante (eleve) la cabecera de la cama aproximadamente 6 pulgadas (15 cm). Para hacerlo, es posible que tenga que utilizar una cua. Evite inclinarse si al hacerlo empeoran los sntomas. Cumpla con todas las visitas de seguimiento. Comunquese con un mdico si: Aparecen nuevos sntomas. [place] y no sabe por qu. Tiene problemas para tragar o le duele cuando traga. Tiene sibilancias o tos persistente. Tiene la voz ronca. Los sntomas no mejoran con Dispensing optician. Solicite ayuda de inmediato si: Tree surgeon repentino [employer], el cuello, la [place], los dientes o la espalda. De repente se siente Dellia Nims  o aturdido. Siente falta de aire o Tourist information centre manager. Vomita y el vmito es de color verde, amarillo o negro, o tiene un aspecto similar a la sangre o a los posos de caf. Se desmaya. Las heces (deposiciones) son rojas, sanguinolentas o negras. No puede tragar, beber o comer. Estos sntomas pueden representar un problema grave que constituye Engineer, maintenance (IT). No espere a ver si los sntomas desaparecen.  Solicite atencin mdica de inmediato. Comunquese con el servicio de emergencias de su localidad (911 en los [place]). No conduzca por sus propios medios [employer]. Resumen Si una persona tiene enfermedad de reflujo gastroesofgico (ERGE), los alimentos y el cido estomacal suben al esfago y causan sntomas o problemas tales como dao en el esfago. El tratamiento depender de la gravedad de los sntomas. Siga una dieta como se lo haya indicado el mdico. Use todos los medicamentos solamente como se lo haya indicado el mdico. Esta informacin no tiene Marine scientist el consejo del mdico. Asegrese de hacerle al mdico cualquier pregunta que tenga. Document Revised: 10/27/2019 Document Reviewed: 10/27/2019 Elsevier Patient Education  [place].

## 2022-01-01 NOTE — Progress Notes (Signed)
Cardiology Office Note   Date:  01/01/2022   ID:  Kelly Humphrey, Kelly Humphrey 1976/03/26, MRN [id number]  PCP:  Kelly Fenton, NP  Cardiologist:   Kelly Sacramento, MD   Chief Complaint  Patient presents with   Other    C/o chest pressure and palpitations. Meds reviewed verbally with pt.      History of Present Illness: Kelly Humphrey is a 46 y.o. female who was referred by Kelly Humphrey for evaluation of chest pain. She presented recently to urgent care with chest pain and was sent to the ED. EKG showed no ischemic changes.  PACs noted.  Troponin was normal.  Labs showed mild anemia with a hemoglobin of 11.8 and mild hypokalemia 3.4. She reports substernal chest tightness and burning sensation that can happen at rest or with exertion.  It is associated with palpitations.  She has burning sensation that gets worse after eating spicy food and drinking coffee.  No prior cardiac history.  She is a lifelong non-smoker and does not drink alcohol.  She does report history of hyperlipidemia not requiring treatment.  She is not diabetic and has no history of hypertension.  There is no family history of premature coronary artery disease.  History reviewed. No pertinent past medical history.  Past Surgical History:  Procedure Laterality Date   NO PAST SURGERIES       No current outpatient medications on file.   No current facility-administered medications for this visit.    Allergies:   Patient has no known allergies.    Social History:  The patient  reports that she has never smoked. She has never used smokeless tobacco. She reports that she does not drink alcohol and does not use drugs.   Family History:  The patient's family history includes Breast cancer in her sister; Heart attack in her father.    ROS:  Please see the history of present illness.   Otherwise, review of systems are positive for none.   All other systems are reviewed and negative.    PHYSICAL  EXAM: VS:  BP 102/62 (BP Location: Right Arm, Patient Position: Sitting, Cuff Size: Normal)   Pulse 72   Ht '4\' 11"'$  (1.499 m)   Wt 133 lb 4 oz (60.4 kg)   SpO2 99%   BMI 26.91 kg/m  , BMI Body mass index is 26.91 kg/m. GEN: Well nourished, well developed, in no acute distress  HEENT: normal  Neck: no JVD, carotid bruits, or masses Cardiac: RRR; no murmurs, rubs, or gallops,no edema  Respiratory:  clear to auscultation bilaterally, normal work of breathing GI: soft, nontender, nondistended, + BS MS: no deformity or atrophy  Skin: warm and dry, no rash Neuro:  Strength and sensation are intact Psych: euthymic mood, full affect   EKG:  EKG is ordered today. The ekg ordered today demonstrates sinus rhythm with low voltage.  No significant ST or T wave changes.   Recent Labs: 12/26/2021: BUN 17; Creatinine, Ser 0.68; Hemoglobin 11.8; Platelets 241; Potassium 3.4; Sodium 141    Lipid Panel No results found for: "CHOL", "TRIG", "HDL", "CHOLHDL", "VLDL", "LDLCALC", "LDLDIRECT"    Wt Readings from Last 3 Encounters:  01/01/22 133 lb 4 oz (60.4 kg)  12/26/21 140 lb 10.5 oz (63.8 kg)  01/30/21 140 lb 9.6 oz (63.8 kg)           No data to display            ASSESSMENT AND  PLAN:  1.  Atypical chest pain: The pain is likely GI in nature but given that she complains of occasional symptoms with exertion and associated palpitations, I requested a treadmill stress test.  2.  GERD: Her symptoms are mostly suggestive of this.  I discussed with her the dietary modifications needed and also will give her Protonix 40 mg once daily for 1 month.  She is to follow-up with primary care physician after that to ensure resolution of symptoms.  If cardiac work-up is negative and her symptoms persist, recommend GI evaluation.    Disposition:   FU with me as needed if stress test is abnormal.  Signed,  Kelly Sacramento, MD  01/01/2022 2:23 PM    Kelly Humphrey

## 2022-01-21 DIAGNOSIS — L237 Allergic contact dermatitis due to plants, except food: Secondary | ICD-10-CM | POA: Diagnosis not present

## 2022-01-28 ENCOUNTER — Other Ambulatory Visit: Payer: Self-pay | Admitting: Cardiovascular Disease

## 2022-02-17 ENCOUNTER — Ambulatory Visit: Payer: No Typology Code available for payment source | Attending: Cardiovascular Disease

## 2022-02-17 DIAGNOSIS — R079 Chest pain, unspecified: Secondary | ICD-10-CM | POA: Diagnosis not present

## 2022-02-18 LAB — EXERCISE TOLERANCE TEST
Angina Index: 0
Base ST Depression (mm): 0 mm
Duke Treadmill Score: 10
Estimated workload: 11.7
Exercise duration (min): 10 min
Exercise duration (sec): 0 s
MPHR: 172 {beats}/min
Peak HR: 166 {beats}/min
Percent HR: 96 %
RPE: 16
Rest HR: 72 {beats}/min
ST Depression (mm): 0 mm

## 2022-05-08 ENCOUNTER — Other Ambulatory Visit: Payer: Self-pay | Admitting: Cardiovascular Disease

## 2022-05-08 NOTE — Telephone Encounter (Signed)
Please advise if ok to refill Pantoprazole 40 mg tablet.

## 2022-05-18 DIAGNOSIS — Z03818 Encounter for observation for suspected exposure to other biological agents ruled out: Secondary | ICD-10-CM | POA: Diagnosis not present

## 2022-05-18 DIAGNOSIS — J019 Acute sinusitis, unspecified: Secondary | ICD-10-CM | POA: Diagnosis not present

## 2022-05-18 DIAGNOSIS — R591 Generalized enlarged lymph nodes: Secondary | ICD-10-CM | POA: Diagnosis not present

## 2022-05-18 DIAGNOSIS — H9202 Otalgia, left ear: Secondary | ICD-10-CM | POA: Diagnosis not present

## 2022-06-18 ENCOUNTER — Encounter: Payer: Self-pay | Admitting: Internal Medicine

## 2022-06-18 ENCOUNTER — Ambulatory Visit (INDEPENDENT_AMBULATORY_CARE_PROVIDER_SITE_OTHER): Payer: BC Managed Care – PPO | Admitting: Internal Medicine

## 2022-06-18 VITALS — BP 112/70 | HR 66 | Temp 96.8°F | Ht 59.0 in | Wt 132.0 lb

## 2022-06-18 DIAGNOSIS — E663 Overweight: Secondary | ICD-10-CM

## 2022-06-18 DIAGNOSIS — Z23 Encounter for immunization: Secondary | ICD-10-CM | POA: Diagnosis not present

## 2022-06-18 DIAGNOSIS — Z0001 Encounter for general adult medical examination with abnormal findings: Secondary | ICD-10-CM

## 2022-06-18 DIAGNOSIS — Z1159 Encounter for screening for other viral diseases: Secondary | ICD-10-CM | POA: Diagnosis not present

## 2022-06-18 DIAGNOSIS — Z1211 Encounter for screening for malignant neoplasm of colon: Secondary | ICD-10-CM | POA: Diagnosis not present

## 2022-06-18 DIAGNOSIS — Z6826 Body mass index (BMI) 26.0-26.9, adult: Secondary | ICD-10-CM

## 2022-06-18 DIAGNOSIS — Z114 Encounter for screening for human immunodeficiency virus [HIV]: Secondary | ICD-10-CM | POA: Diagnosis not present

## 2022-06-18 DIAGNOSIS — R7309 Other abnormal glucose: Secondary | ICD-10-CM | POA: Diagnosis not present

## 2022-06-18 NOTE — Patient Instructions (Signed)

## 2022-06-18 NOTE — Assessment & Plan Note (Signed)
Encouraged diet and exercise for weight loss ?

## 2022-06-18 NOTE — Progress Notes (Signed)
Subjective:    Patient ID: Kelly Humphrey, female    DOB: 1975/05/28, 47 y.o.   MRN: [id number]  HPI  Patient presents to clinic today for annual exam.  Flu: 01/2021 Tetanus: > 10 years ago COVID: Pfizer x 2 Pap smear: 06/2020 Mammogram: 08/2021 Colon screening: never Vision screening: annually Dentist: biannually  Diet: She does eat meat. She consumes fruits and veggies. She tries to avoid fried foods. She drinks mostly water and sweet tea. Exercise: Walking  Review of Systems     No past medical history on file.  Current Outpatient Medications  Medication Sig Dispense Refill   pantoprazole (PROTONIX) 40 MG tablet Take 1 tablet (40 mg total) by mouth daily. 30 tablet 0   No current facility-administered medications for this visit.    No Known Allergies  Family History  Problem Relation Age of Onset   Heart attack Father    Breast cancer Sister     Social History   Socioeconomic History   Marital status: Married    Spouse name: Not on file   Number of children: Not on file   Years of education: Not on file   Highest education level: Not on file  Occupational History   Not on file  Tobacco Use   Smoking status: Never   Smokeless tobacco: Never  Vaping Use   Vaping Use: Never used  Substance and Sexual Activity   Alcohol use: No   Drug use: No   Sexual activity: Yes    Birth control/protection: Post-menopausal  Other Topics Concern   Not on file  Social History Narrative   Not on file   Social Determinants of Health   Financial Resource Strain: Not on file  Food Insecurity: Not on file  Transportation Needs: Not on file  Physical Activity: Not on file  Stress: Not on file  Social Connections: Not on file  Intimate Partner Violence: Not on file     Constitutional: Denies fever, malaise, fatigue, headache or abrupt weight changes.  HEENT: Denies eye pain, eye redness, ear pain, ringing in the ears, wax buildup, runny nose, nasal  congestion, bloody nose, or sore throat. Respiratory: Denies difficulty breathing, shortness of breath, cough or sputum production.   Cardiovascular: Denies chest pain, chest tightness, palpitations or swelling in the hands or feet.  Gastrointestinal: Denies abdominal pain, bloating, constipation, diarrhea or blood in the stool.  GU: Patient reports vaginal dryness.  Denies urgency, frequency, pain with urination, burning sensation, blood in urine, odor or discharge. Musculoskeletal: Pt reports calf pain. Denies decrease in range of motion, difficulty with gait, muscle pain or joint pain and swelling.  Skin: Denies redness, rashes, lesions or ulcercations.  Neurological: Pt reports burning sensation in feet. Denies dizziness, difficulty with memory, difficulty with speech or problems with balance and coordination.  Psych: Denies anxiety, depression, SI/HI.  No other specific complaints in a complete review of systems (except as listed in HPI above).  Objective:   Physical Exam  BP 112/70 (BP Location: Left Arm, Patient Position: Sitting)   Pulse 66   Temp (!) 96.8 F (36 C) (Temporal)   Ht 4\' 11"  (1.499 m)   Wt 132 lb (59.9 kg)   SpO2 99%   BMI 26.66 kg/m   Wt Readings from Last 3 Encounters:  01/01/22 133 lb 4 oz (60.4 kg)  12/26/21 140 lb 10.5 oz (63.8 kg)  01/30/21 140 lb 9.6 oz (63.8 kg)    General: Appears her stated age, overweight,  in NAD. Skin: Warm, dry and intact. No rashes noted. HEENT: Head: normal shape and size; Eyes: sclera white, no icterus, conjunctiva pink, PERRLA and EOMs intact;  Neck:  Neck supple, trachea midline. No masses, lumps or thyromegaly present.  Cardiovascular: Normal rate and rhythm. S1,S2 noted.  No murmur, rubs or gallops noted. No JVD or BLE edema. No carotid bruits noted. Pulmonary/Chest: Normal effort and positive vesicular breath sounds. No respiratory distress. No wheezes, rales or ronchi noted.  Abdomen: Soft and nontender. Normal bowel  sounds.  Musculoskeletal: Strength 5/5 BUE/BLE.  No difficulty with gait.  Neurological: Alert and oriented. Cranial nerves II-XII grossly intact. Coordination normal.  Psychiatric: Mood and affect normal. Behavior is normal. Judgment and thought content normal.    BMET    Component Value Date/Time   NA 141 12/26/2021 1503   NA 144 07/02/2020 1625   K 3.4 (L) 12/26/2021 1503   CL 107 12/26/2021 1503   CO2 27 12/26/2021 1503   GLUCOSE 91 12/26/2021 1503   BUN 17 12/26/2021 1503   BUN 12 07/02/2020 1625   CREATININE 0.68 12/26/2021 1503   CALCIUM 9.0 12/26/2021 1503   GFRNONAA >60 12/26/2021 1503    Lipid Panel  No results found for: "CHOL", "TRIG", "HDL", "CHOLHDL", "VLDL", "LDLCALC"  CBC    Component Value Date/Time   WBC 8.3 12/26/2021 1503   RBC 4.52 12/26/2021 1503   HGB 11.8 (L) 12/26/2021 1503   HGB 11.8 07/02/2020 1625   HCT 38.3 12/26/2021 1503   HCT 37.2 07/02/2020 1625   PLT 241 12/26/2021 1503   MCV 84.7 12/26/2021 1503   MCV 81 07/02/2020 1625   MCH 26.1 12/26/2021 1503   MCHC 30.8 12/26/2021 1503   RDW 14.2 12/26/2021 1503   RDW 14.5 07/02/2020 1625   LYMPHSABS 3.2 (H) 07/02/2020 1625   EOSABS 0.1 07/02/2020 1625   BASOSABS 0.0 07/02/2020 1625    Hgb A1C No results found for: "HGBA1C"         Assessment & Plan:   Preventative Health Maintenance:  Encouraged her to get a flu shot in the fall Tetanus today Encouraged her to get her COVID booster Pap smear UTD Mammogram ordered-she will call to schedule Referral to GI for screening colonoscopy Encouraged her to consume a balanced diet and exercise regimen Advised her to see a dentist annually We will check CBC, c-Met, lipid, A1c, HIV and hep C today   RTC 1 year, for your annual exam Webb Silversmith, NP

## 2022-06-19 LAB — COMPLETE METABOLIC PANEL WITH GFR
AG Ratio: 1.4 (calc) (ref 1.0–2.5)
ALT: 13 U/L (ref 6–29)
AST: 18 U/L (ref 10–35)
Albumin: 4.1 g/dL (ref 3.6–5.1)
Alkaline phosphatase (APISO): 88 U/L (ref 31–125)
BUN: 21 mg/dL (ref 7–25)
CO2: 28 mmol/L (ref 20–32)
Calcium: 9.6 mg/dL (ref 8.6–10.2)
Chloride: 106 mmol/L (ref 98–110)
Creat: 0.66 mg/dL (ref 0.50–0.99)
Globulin: 3 g/dL (calc) (ref 1.9–3.7)
Glucose, Bld: 89 mg/dL (ref 65–139)
Potassium: 4.6 mmol/L (ref 3.5–5.3)
Sodium: 142 mmol/L (ref 135–146)
Total Bilirubin: 0.2 mg/dL (ref 0.2–1.2)
Total Protein: 7.1 g/dL (ref 6.1–8.1)
eGFR: 108 mL/min/{1.73_m2} (ref >=60)

## 2022-06-19 LAB — CBC
HCT: 35.3 % (ref 35.0–45.0)
Hemoglobin: 11.2 g/dL — ABNORMAL LOW (ref 11.7–15.5)
MCH: 25.6 pg — ABNORMAL LOW (ref 27.0–33.0)
MCHC: 31.7 g/dL — ABNORMAL LOW (ref 32.0–36.0)
MCV: 80.6 fL (ref 80.0–100.0)
MPV: 11.1 fL (ref 7.5–12.5)
Platelets: 263 10*3/uL (ref 140–400)
RBC: 4.38 10*6/uL (ref 3.80–5.10)
RDW: 13.2 % (ref 11.0–15.0)
WBC: 7.8 10*3/uL (ref 3.8–10.8)

## 2022-06-19 LAB — HEMOGLOBIN A1C
Hgb A1c MFr Bld: 5.7 % of total Hgb — ABNORMAL HIGH (ref <5.7)
Mean Plasma Glucose: 117 mg/dL
eAG (mmol/L): 6.5 mmol/L

## 2022-06-19 LAB — LIPID PANEL
Cholesterol: 212 mg/dL — ABNORMAL HIGH (ref <200)
HDL: 63 mg/dL (ref >=50)
LDL Cholesterol (Calc): 125 mg/dL (calc) — ABNORMAL HIGH
Non-HDL Cholesterol (Calc): 149 mg/dL (calc) — ABNORMAL HIGH (ref <130)
Total CHOL/HDL Ratio: 3.4 (calc) (ref <5.0)
Triglycerides: 128 mg/dL (ref <150)

## 2022-06-19 LAB — HEPATITIS C ANTIBODY: Hepatitis C Ab: NONREACTIVE

## 2022-06-19 LAB — HIV ANTIBODY (ROUTINE TESTING W REFLEX): HIV 1&2 Ab, 4th Generation: NONREACTIVE

## 2022-06-29 ENCOUNTER — Telehealth: Payer: Self-pay

## 2022-06-29 ENCOUNTER — Other Ambulatory Visit: Payer: Self-pay

## 2022-06-29 DIAGNOSIS — Z1211 Encounter for screening for malignant neoplasm of colon: Secondary | ICD-10-CM

## 2022-06-29 MED ORDER — NA SULFATE-K SULFATE-MG SULF 17.5-3.13-1.6 GM/177ML PO SOLN: 1.0000 | Freq: Once | ORAL | 0 refills | Status: AC

## 2022-06-29 NOTE — Telephone Encounter (Signed)
Gastroenterology Pre-Procedure Review  Request Date: 07/14/22 Requesting Physician: Dr. Vicente Males  PATIENT REVIEW QUESTIONS: The patient responded to the following health history questions as indicated:    1. Are you having any GI issues? no 2. Do you have a personal history of Polyps? no 3. Do you have a family history of Colon Cancer or Polyps? no 4. Diabetes Mellitus? no 5. Joint replacements in the past 12 months?no 6. Major health problems in the past 3 months?no 7. Any artificial heart valves, MVP, or defibrillator?no    MEDICATIONS & ALLERGIES:    Patient reports the following regarding taking any anticoagulation/antiplatelet therapy:   Plavix, Coumadin, Eliquis, Xarelto, Lovenox, Pradaxa, Brilinta, or Effient? no Aspirin? no  Patient confirms/reports the following medications:  No current outpatient medications on file.   No current facility-administered medications for this visit.    Patient confirms/reports the following allergies:  No Known Allergies  No orders of the defined types were placed in this encounter.   AUTHORIZATION INFORMATION Primary Insurance: 1D#: Group #:  Secondary Insurance: 1D#: Group #:  SCHEDULE INFORMATION: Date: 07/14/22 Time: Location: [facility]

## 2022-06-30 ENCOUNTER — Telehealth: Payer: Self-pay

## 2022-06-30 NOTE — Telephone Encounter (Signed)
Returned phone call to patient to reschedule her colonoscopy.  Patient colonoscopy has been rescheduled to 07/31/22 still w/Dr. Vicente Males at [facility]  Trish in Endo has been notified.

## 2022-07-28 ENCOUNTER — Telehealth: Payer: Self-pay

## 2022-07-28 NOTE — Telephone Encounter (Signed)
Patient has some questions about colonoscopy that is schedule for Friday.

## 2022-07-29 NOTE — Telephone Encounter (Signed)
Returned phone call to patient.  LVM for pt to return my call.   Thanks, [place], [place]

## 2022-07-30 ENCOUNTER — Encounter: Payer: Self-pay | Admitting: Gastroenterology

## 2022-07-31 ENCOUNTER — Encounter: Payer: Self-pay | Admitting: Anesthesiology

## 2022-07-31 ENCOUNTER — Encounter
Admission: RE | Disposition: A | Discharge status: Home / Self Care | Payer: Self-pay | Source: Ambulatory Visit | Attending: Gastroenterology

## 2022-07-31 ENCOUNTER — Encounter: Payer: Self-pay | Admitting: Gastroenterology

## 2022-07-31 ENCOUNTER — Ambulatory Visit
Admission: RE | Admit: 2022-07-31 | Discharge: 2022-07-31 | Disposition: A | Discharge status: Home / Self Care | Payer: BC Managed Care – PPO | Source: Ambulatory Visit | Attending: Gastroenterology | Admitting: Gastroenterology

## 2022-07-31 DIAGNOSIS — Z539 Procedure and treatment not carried out, unspecified reason: Secondary | ICD-10-CM | POA: Diagnosis not present

## 2022-07-31 HISTORY — PX: COLONOSCOPY WITH PROPOFOL: SHX5780

## 2022-07-31 SURGERY — COLONOSCOPY WITH PROPOFOL
Anesthesia: General

## 2022-07-31 NOTE — Anesthesia Preprocedure Evaluation (Signed)
Anesthesia Evaluation    Airway        Dental   Pulmonary           Cardiovascular      Neuro/Psych    GI/Hepatic   Endo/Other    Renal/GU      Musculoskeletal   Abdominal   Peds  Hematology   Anesthesia Other Findings   Reproductive/Obstetrics                              Anesthesia Physical Anesthesia Plan Anesthesia Quick Evaluation  

## 2022-07-31 NOTE — Progress Notes (Signed)
Patient procedure cancelled due to inadequate colon prep.  Office will call patient to reschedule.

## 2022-08-03 ENCOUNTER — Encounter: Payer: Self-pay | Admitting: Gastroenterology

## 2022-08-04 ENCOUNTER — Other Ambulatory Visit: Payer: Self-pay

## 2022-08-04 DIAGNOSIS — K921 Melena: Secondary | ICD-10-CM

## 2022-08-05 ENCOUNTER — Telehealth: Payer: Self-pay

## 2022-08-05 NOTE — Telephone Encounter (Signed)
-----   Message from Toney Reil, MD sent at 08/04/2022  2:13 PM EDT ----- Regarding: RE: next week Already taken care, he tagged wrong pt, she is the one you called yesterday   RV  ----- Message ----- From: Adela Ports, CMA Sent: 08/04/2022   2:08 PM EDT To: Toney Reil, MD Subject: RE: next week                                  Dr. Tobi Bastos, I don't see any labs that had been ordered. I will place the order and let the patient know. Dr. Allegra Lai, I do not see that the patient had a capsule study, or am I missing something?  ----- Message ----- From: Wyline Mood, MD Sent: 07/31/2022  11:41 AM EDT To: Celso Amy; Toney Reil, MD; # Subject: next week                                      Hi Inetta Fermo   This is a patient I saw at the office yesterday . Was at the hospital last week scoped by Dr Timothy Lasso for hematochezia and found diverticulosis and felt diverticular bleed. Then sent home  Hb 2 days back : had dropped a bit and she continued to have hematochezia with clots on and off .   I did EGD + flex sig today but I went all the way to cecum : no active bleeding but stool was black . She possibly has a small bowel bleed. She is going to have a capsule today   For next week  Sambath can you make sure patient has a Hb on Monday or Tuesday and once resulted run it with Inetta Fermo to ensure no significant drop   RV can you read capsule on Monday please  Thanks guys  Regards Kiran

## 2022-08-05 NOTE — Telephone Encounter (Signed)
Called patient yesterday and today and had to leave her a detailed message that Dr. Tobi Bastos wanted her to have a repeat lab. She was told to come in to our office on lab hours. I have not heard back from patient or seen that she had her lab drawn.

## 2022-08-06 ENCOUNTER — Other Ambulatory Visit: Payer: Self-pay

## 2022-08-06 ENCOUNTER — Telehealth: Payer: Self-pay

## 2022-08-06 DIAGNOSIS — Z1211 Encounter for screening for malignant neoplasm of colon: Secondary | ICD-10-CM

## 2022-08-06 MED ORDER — NA SULFATE-K SULFATE-MG SULF 17.5-3.13-1.6 GM/177ML PO SOLN: 1.0000 | Freq: Once | ORAL | 0 refills | Status: AC

## 2022-08-06 NOTE — Telephone Encounter (Signed)
Patient contacted office to have her colonoscopy rescheduled.  She said on the morning of her procedure she ate something.  I asked her if she now understands how to prepare for her colonoscopy.  She said she understands now and will be sure not to eat anything 4 hours before her colonoscopy.  Colonoscopy has been scheduled on 10/16/22.

## 2022-09-21 ENCOUNTER — Other Ambulatory Visit (HOSPITAL_COMMUNITY): Payer: Self-pay | Admitting: Internal Medicine

## 2022-09-21 DIAGNOSIS — Z1231 Encounter for screening mammogram for malignant neoplasm of breast: Secondary | ICD-10-CM

## 2022-09-23 ENCOUNTER — Ambulatory Visit (HOSPITAL_COMMUNITY)
Admission: RE | Admit: 2022-09-23 | Discharge: 2022-09-23 | Disposition: A | Discharge status: Home / Self Care | Payer: BC Managed Care – PPO | Source: Ambulatory Visit | Attending: Internal Medicine | Admitting: Internal Medicine

## 2022-09-23 DIAGNOSIS — Z1231 Encounter for screening mammogram for malignant neoplasm of breast: Secondary | ICD-10-CM | POA: Insufficient documentation

## 2022-10-16 ENCOUNTER — Ambulatory Visit
Admission: RE | Admit: 2022-10-16 | Payer: BC Managed Care – PPO | Source: Ambulatory Visit | Admitting: Gastroenterology

## 2022-10-16 ENCOUNTER — Encounter: Admission: RE | Payer: Self-pay | Source: Ambulatory Visit

## 2022-10-16 ENCOUNTER — Telehealth: Payer: Self-pay

## 2022-10-16 SURGERY — COLONOSCOPY WITH PROPOFOL
Anesthesia: General

## 2022-10-16 NOTE — Telephone Encounter (Signed)
Patient states she is going out of town for the next couple of months and she will call us back when she wants to reschedule. Called penny and informed penny of this information

## 2022-11-13 DIAGNOSIS — J029 Acute pharyngitis, unspecified: Secondary | ICD-10-CM | POA: Diagnosis not present

## 2022-11-13 DIAGNOSIS — Z03818 Encounter for observation for suspected exposure to other biological agents ruled out: Secondary | ICD-10-CM | POA: Diagnosis not present

## 2022-12-18 ENCOUNTER — Encounter: Payer: Self-pay | Admitting: Internal Medicine

## 2022-12-18 ENCOUNTER — Ambulatory Visit (INDEPENDENT_AMBULATORY_CARE_PROVIDER_SITE_OTHER): Payer: BC Managed Care – PPO | Admitting: Internal Medicine

## 2022-12-18 VITALS — BP 122/78 | HR 79 | Wt 136.0 lb

## 2022-12-18 DIAGNOSIS — E663 Overweight: Secondary | ICD-10-CM

## 2022-12-18 DIAGNOSIS — Z6827 Body mass index (BMI) 27.0-27.9, adult: Secondary | ICD-10-CM

## 2022-12-18 DIAGNOSIS — R7303 Prediabetes: Secondary | ICD-10-CM | POA: Diagnosis not present

## 2022-12-18 DIAGNOSIS — E7800 Pure hypercholesterolemia, unspecified: Secondary | ICD-10-CM | POA: Diagnosis not present

## 2022-12-18 DIAGNOSIS — D500 Iron deficiency anemia secondary to blood loss (chronic): Secondary | ICD-10-CM

## 2022-12-18 DIAGNOSIS — Z23 Encounter for immunization: Secondary | ICD-10-CM

## 2022-12-18 NOTE — Assessment & Plan Note (Addendum)
A1c today Encourage low-carb diet and exercise for weight loss

## 2022-12-18 NOTE — Assessment & Plan Note (Signed)
Encourage diet and exercise for weight loss 

## 2022-12-18 NOTE — Assessment & Plan Note (Addendum)
CBC with differential today

## 2022-12-18 NOTE — Assessment & Plan Note (Signed)
C-Met and lipid profile today Encouraged to consume a low-fat diet

## 2022-12-18 NOTE — Progress Notes (Unsigned)
Subjective:    Patient ID: Kelly Humphrey, female    DOB: 1975/04/10, 47 y.o.   MRN: [id number]  HPI  Patient presents to clinic today for 32-month follow-up of chronic conditions.  Prediabetes: Her last A1c was 5.7%, 05/2022.  She is not taking any oral diabetic medication at this time.  She does not check her sugars.  HLD: Her last LDL was 125, triglycerides 128, 05/2022.  She is not taking any cholesterol-lowering medication at this time.  She has been trying to consume a low-fat diet.  Anemia: Her last H/H was 11.2/35.3, 05/2022.  She is not taking any oral iron at this time.  She does not follow with hematology.  Review of Systems     No past medical history on file.  Current Outpatient Medications  Medication Sig Dispense Refill   fluticasone (FLONASE) 50 MCG/ACT nasal spray Place 2 sprays into both nostrils daily. (Patient not taking: Reported on 07/31/2022)     No current facility-administered medications for this visit.    No Known Allergies  Family History  Problem Relation Age of Onset   Heart attack Father    Breast cancer Sister     Social History   Socioeconomic History   Marital status: Married    Spouse name: Not on file   Number of children: Not on file   Years of education: Not on file   Highest education level: Not on file  Occupational History   Not on file  Tobacco Use   Smoking status: Never   Smokeless tobacco: Never  Vaping Use   Vaping status: Never Used  Substance and Sexual Activity   Alcohol use: No   Drug use: No   Sexual activity: Yes    Birth control/protection: Post-menopausal  Other Topics Concern   Not on file  Social History Narrative   Not on file   Social Determinants of Health   Financial Resource Strain: Not on file  Food Insecurity: Not on file  Transportation Needs: Not on file  Physical Activity: Not on file  Stress: Not on file  Social Connections: Not on file  Intimate Partner Violence: Not on file      Constitutional: Denies fever, malaise, fatigue, headache or abrupt weight changes.  HEENT: Denies eye pain, eye redness, ear pain, ringing in the ears, wax buildup, runny nose, nasal congestion, bloody nose, or sore throat. Respiratory: Denies difficulty breathing, shortness of breath, cough or sputum production.   Cardiovascular: Denies chest pain, chest tightness, palpitations or swelling in the hands or feet.  Gastrointestinal: Denies abdominal pain, bloating, constipation, diarrhea or blood in the stool.  GU: Denies urgency, frequency, pain with urination, burning sensation, blood in urine, odor or discharge. Musculoskeletal: Denies decrease in range of motion, difficulty with gait, muscle pain or joint pain and swelling.  Skin: Denies redness, rashes, lesions or ulcercations.  Neurological: Denies dizziness, difficulty with memory, difficulty with speech or problems with balance and coordination.  Psych: Denies anxiety, depression, SI/HI.  No other specific complaints in a complete review of systems (except as listed in HPI above).  Objective:   Physical Exam   BP 122/78 (BP Location: Left Arm, Patient Position: Sitting, Cuff Size: Normal)   Pulse 79   Wt 136 lb (61.7 kg)   SpO2 96%   BMI 27.47 kg/m   Wt Readings from Last 3 Encounters:  06/18/22 132 lb (59.9 kg)  01/01/22 133 lb 4 oz (60.4 kg)  12/26/21 140 lb 10.5 oz (  63.8 kg)    General: Appears her stated age, overweight, in NAD. Skin: Warm, dry and intact. HEENT: Head: normal shape and size; Eyes: sclera white, no icterus, conjunctiva pink, PERRLA and EOMs intact;  Cardiovascular: Normal rate and rhythm. S1,S2 noted.  No murmur, rubs or gallops noted. No JVD or BLE edema.  Pulmonary/Chest: Normal effort and positive vesicular breath sounds. No respiratory distress. No wheezes, rales or ronchi noted.  Musculoskeletal: No difficulty with gait.  Neurological: Alert and oriented. Coordination normal.   BMET     Component Value Date/Time   NA 142 06/18/2022 1540   NA 144 07/02/2020 1625   K 4.6 06/18/2022 1540   CL 106 06/18/2022 1540   CO2 28 06/18/2022 1540   GLUCOSE 89 06/18/2022 1540   BUN 21 06/18/2022 1540   BUN 12 07/02/2020 1625   CREATININE 0.66 06/18/2022 1540   CALCIUM 9.6 06/18/2022 1540   GFRNONAA >60 12/26/2021 1503    Lipid Panel     Component Value Date/Time   CHOL 212 (H) 06/18/2022 1540   TRIG 128 06/18/2022 1540   HDL 63 06/18/2022 1540   CHOLHDL 3.4 06/18/2022 1540   LDLCALC 125 (H) 06/18/2022 1540    CBC    Component Value Date/Time   WBC 7.8 06/18/2022 1540   RBC 4.38 06/18/2022 1540   HGB 11.2 (L) 06/18/2022 1540   HGB 11.8 07/02/2020 1625   HCT 35.3 06/18/2022 1540   HCT 37.2 07/02/2020 1625   PLT 263 06/18/2022 1540   MCV 80.6 06/18/2022 1540   MCV 81 07/02/2020 1625   MCH 25.6 (L) 06/18/2022 1540   MCHC 31.7 (L) 06/18/2022 1540   RDW 13.2 06/18/2022 1540   RDW 14.5 07/02/2020 1625   LYMPHSABS 3.2 (H) 07/02/2020 1625   EOSABS 0.1 07/02/2020 1625   BASOSABS 0.0 07/02/2020 1625    Hgb A1C Lab Results  Component Value Date   HGBA1C 5.7 (H) 06/18/2022           Assessment & Plan:      RTC in 6 months for your annual exam Nicki Reaper, NP

## 2022-12-18 NOTE — Patient Instructions (Signed)

## 2022-12-19 LAB — COMPLETE METABOLIC PANEL WITH GFR
AG Ratio: 1.4 (calc) (ref 1.0–2.5)
ALT: 13 U/L (ref 6–29)
AST: 17 U/L (ref 10–35)
Albumin: 3.9 g/dL (ref 3.6–5.1)
Alkaline phosphatase (APISO): 93 U/L (ref 31–125)
BUN: 18 mg/dL (ref 7–25)
CO2: 27 mmol/L (ref 20–32)
Calcium: 8.9 mg/dL (ref 8.6–10.2)
Chloride: 106 mmol/L (ref 98–110)
Creat: 0.64 mg/dL (ref 0.50–0.99)
Globulin: 2.8 g/dL (calc) (ref 1.9–3.7)
Glucose, Bld: 87 mg/dL (ref 65–139)
Potassium: 4.1 mmol/L (ref 3.5–5.3)
Sodium: 140 mmol/L (ref 135–146)
Total Bilirubin: 0.2 mg/dL (ref 0.2–1.2)
Total Protein: 6.7 g/dL (ref 6.1–8.1)
eGFR: 109 mL/min/{1.73_m2} (ref >=60)

## 2022-12-19 LAB — CBC WITH DIFFERENTIAL/PLATELET
Absolute Monocytes: 422 cells/uL (ref 200–950)
Basophils Absolute: 32 cells/uL (ref 0–200)
Basophils Relative: 0.5 %
Eosinophils Absolute: 38 cells/uL (ref 15–500)
Eosinophils Relative: 0.6 %
HCT: 33.7 % — ABNORMAL LOW (ref 35.0–45.0)
Hemoglobin: 10.1 g/dL — ABNORMAL LOW (ref 11.7–15.5)
Lymphs Abs: 2766 cells/uL (ref 850–3900)
MCH: 23.9 pg — ABNORMAL LOW (ref 27.0–33.0)
MCHC: 30 g/dL — ABNORMAL LOW (ref 32.0–36.0)
MCV: 79.9 fL — ABNORMAL LOW (ref 80.0–100.0)
MPV: 11.1 fL (ref 7.5–12.5)
Monocytes Relative: 6.7 %
Neutro Abs: 3043 cells/uL (ref 1500–7800)
Neutrophils Relative %: 48.3 %
Platelets: 255 10*3/uL (ref 140–400)
RBC: 4.22 10*6/uL (ref 3.80–5.10)
RDW: 14.5 % (ref 11.0–15.0)
Total Lymphocyte: 43.9 %
WBC: 6.3 10*3/uL (ref 3.8–10.8)

## 2022-12-19 LAB — LIPID PANEL
Cholesterol: 204 mg/dL — ABNORMAL HIGH (ref <200)
HDL: 58 mg/dL (ref >=50)
LDL Cholesterol (Calc): 128 mg/dL (calc) — ABNORMAL HIGH
Non-HDL Cholesterol (Calc): 146 mg/dL (calc) — ABNORMAL HIGH (ref <130)
Total CHOL/HDL Ratio: 3.5 (calc) (ref <5.0)
Triglycerides: 83 mg/dL (ref <150)

## 2022-12-19 LAB — HEMOGLOBIN A1C
Hgb A1c MFr Bld: 5.8 % of total Hgb — ABNORMAL HIGH (ref <5.7)
Mean Plasma Glucose: 120 mg/dL
eAG (mmol/L): 6.6 mmol/L

## 2023-01-15 ENCOUNTER — Telehealth: Payer: Self-pay

## 2023-01-15 ENCOUNTER — Other Ambulatory Visit: Payer: Self-pay

## 2023-01-15 DIAGNOSIS — Z1211 Encounter for screening for malignant neoplasm of colon: Secondary | ICD-10-CM

## 2023-01-15 MED ORDER — NA SULFATE-K SULFATE-MG SULF 17.5-3.13-1.6 GM/177ML PO SOLN: 1.0000 | Freq: Once | ORAL | 0 refills | Status: AC

## 2023-01-15 NOTE — Telephone Encounter (Signed)
Patient called in to schedule colonoscopy.

## 2023-01-15 NOTE — Telephone Encounter (Signed)
Returned call to patient to schedule her colonoscopy.  Colonoscopy has been scheduled for 03/05/23 with Dr. Tobi Bastos at [facility].  Thanks,  [place], [place]

## 2023-01-18 ENCOUNTER — Telehealth: Payer: Self-pay | Admitting: Internal Medicine

## 2023-01-18 ENCOUNTER — Ambulatory Visit: Payer: Self-pay

## 2023-01-18 NOTE — Telephone Encounter (Signed)
Patient scheduled.

## 2023-01-18 NOTE — Telephone Encounter (Signed)
     Chief Complaint: Pain right elbow, hurts with movement and when she grasps with her hand. Pain 8/10. Asking to be worked in Thursday or Friday after 3:00. Symptoms: Above Frequency: 1 week ago Pertinent Negatives: Patient denies swelling Disposition: [] ED /[] Urgent Care (no appt availability in office) / [] Appointment(In office/virtual)/ []  [facility]/ [] Home Care/ [] Refused Recommended Disposition /[] [facility]/ [x]  Follow-up with PCP Additional Notes: Please advise pt.  Reason for Disposition  [1] MODERATE pain (e.g., interferes with normal activities) AND [2] present > 3 days  Answer Assessment - Initial Assessment Questions 1. ONSET: "When did the pain start?"      1 week ago 2. LOCATION: "Where is the pain located?"     Right elbow 3. PAIN: "How bad is the pain?" (Scale 1-10; or mild, moderate, severe)   - MILD (1-3): Doesn't interfere with normal activities.   - MODERATE (4-7): Interferes with normal activities (e.g., work or school) or awakens from sleep.   - SEVERE (8-10): Excruciating pain, unable to do any normal activities, unable to hold a cup of water.     8-9 4. WORK OR EXERCISE: "Has there been any recent work or exercise that involved this part of the body?"     No 5. CAUSE: "What do you think is causing the arm pain?"     Unsure 6. OTHER SYMPTOMS: "Do you have any other symptoms?" (e.g., neck pain, swelling, rash, fever, numbness, weakness)     Pain with moving and grasping with hand 7. PREGNANCY: "Is there any chance you are pregnant?" "When was your last menstrual period?"     No  Protocols used: Arm Pain-A-AH

## 2023-01-18 NOTE — Telephone Encounter (Signed)
Patient scheduled to discuss concern.

## 2023-01-18 NOTE — Telephone Encounter (Signed)
Referral Request - Has patient seen PCP for this complaint? No   *If NO, is insurance requiring patient see PCP for this issue before PCP can refer them? Not sure  Referral for which specialty: Rheumatology  Preferred provider/office: [facility]  Reason for referral: pain on right elbow   *Transferred call to NT due to pain.

## 2023-01-29 ENCOUNTER — Ambulatory Visit: Payer: BC Managed Care – PPO | Admitting: Internal Medicine

## 2023-01-29 ENCOUNTER — Encounter: Payer: Self-pay | Admitting: Internal Medicine

## 2023-01-29 VITALS — BP 124/82 | Ht 59.0 in | Wt 134.0 lb

## 2023-01-29 DIAGNOSIS — M7711 Lateral epicondylitis, right elbow: Secondary | ICD-10-CM | POA: Diagnosis not present

## 2023-01-29 MED ORDER — PREDNISONE 10 MG PO TABS: ORAL_TABLET | ORAL | 0 refills | Status: DC

## 2023-01-29 NOTE — Progress Notes (Signed)
Subjective:    Patient ID: Kelly Humphrey, female    DOB: 1975-10-31, 47 y.o.   MRN: [id number]  HPI  Discussed the use of AI scribe software for clinical note transcription with the patient, who gave verbal consent to proceed.   The patient, a right-handed manual laborer, has been experiencing right elbow pain for approximately three to four weeks. The pain, described as a pinching sensation, is not constant but is triggered by heavy lifting and twisting motions, particularly when reaching behind. The pain does not radiates up or down the arm.  She denies swelling, redness, numbness, tingling or weakness of the right upper extremity.  There was no reported injury to the area. The patient attempted self-management with ibuprofen two weeks ago, but it provided no relief.   Review of Systems     No past medical history on file.  No current outpatient medications on file.   No current facility-administered medications for this visit.    No Known Allergies  Family History  Problem Relation Age of Onset   Heart attack Father    Breast cancer Sister     Social History   Socioeconomic History   Marital status: Married    Spouse name: Not on file   Number of children: Not on file   Years of education: Not on file   Highest education level: Not on file  Occupational History   Not on file  Tobacco Use   Smoking status: Never   Smokeless tobacco: Never  Vaping Use   Vaping status: Never Used  Substance and Sexual Activity   Alcohol use: No   Drug use: No   Sexual activity: Yes    Birth control/protection: Post-menopausal  Other Topics Concern   Not on file  Social History Narrative   Not on file   Social Determinants of Health   Financial Resource Strain: Not on file  Food Insecurity: Not on file  Transportation Needs: Not on file  Physical Activity: Not on file  Stress: Not on file  Social Connections: Not on file  Intimate Partner Violence: Not on  file     Constitutional: Denies fever, malaise, fatigue, headache or abrupt weight changes.  Respiratory: Denies difficulty breathing, shortness of breath, cough or sputum production.   Cardiovascular: Denies chest pain, chest tightness, palpitations or swelling in the hands or feet.  Gastrointestinal: Denies abdominal pain, bloating, constipation, diarrhea or blood in the stool.  GU: Denies urgency, frequency, pain with urination, burning sensation, blood in urine, odor or discharge. Musculoskeletal: Patient reports right elbow pain.  Denies decrease in range of motion, difficulty with gait, muscle pain or joint swelling.  Skin: Denies redness, rashes, lesions or ulcercations.  Neurological: Denies dizziness, difficulty with memory, difficulty with speech or problems with balance and coordination.  Psych: Denies anxiety, depression, SI/HI.  No other specific complaints in a complete review of systems (except as listed in HPI above).  Objective:   Physical Exam   BP 124/82   Ht 4\' 11"  (1.499 m)   Wt 134 lb (60.8 kg)   BMI 27.06 kg/m   Wt Readings from Last 3 Encounters:  12/18/22 136 lb (61.7 kg)  06/18/22 132 lb (59.9 kg)  01/01/22 133 lb 4 oz (60.4 kg)    General: Appears her stated age, overweight, in NAD. Skin: Warm, dry and intact. Cardiovascular: Normal rate and rhythm. S1,S2 noted.  No murmur, rubs or gallops noted.  Radial pulse 2+ on the right. Pulmonary/Chest: Normal  effort and positive vesicular breath sounds. No respiratory distress. No wheezes, rales or ronchi noted.  Musculoskeletal: Normal flexion, extension and rotation of the right elbow.  No joint swelling noted.  Pain with palpation over the lateral epicondyle and proximal common extensor tendon.  Strength 5/5 BUE.  Handgrips equal. Neurological: Alert and oriented. Coordination normal of the right hand.    BMET    Component Value Date/Time   NA 140 12/18/2022 1425   NA 144 07/02/2020 1625   K 4.1  12/18/2022 1425   CL 106 12/18/2022 1425   CO2 27 12/18/2022 1425   GLUCOSE 87 12/18/2022 1425   BUN 18 12/18/2022 1425   BUN 12 07/02/2020 1625   CREATININE 0.64 12/18/2022 1425   CALCIUM 8.9 12/18/2022 1425   GFRNONAA >60 12/26/2021 1503    Lipid Panel     Component Value Date/Time   CHOL 204 (H) 12/18/2022 1425   TRIG 83 12/18/2022 1425   HDL 58 12/18/2022 1425   CHOLHDL 3.5 12/18/2022 1425   LDLCALC 128 (H) 12/18/2022 1425    CBC    Component Value Date/Time   WBC 6.3 12/18/2022 1425   RBC 4.22 12/18/2022 1425   HGB 10.1 (L) 12/18/2022 1425   HGB 11.8 07/02/2020 1625   HCT 33.7 (L) 12/18/2022 1425   HCT 37.2 07/02/2020 1625   PLT 255 12/18/2022 1425   MCV 79.9 (L) 12/18/2022 1425   MCV 81 07/02/2020 1625   MCH 23.9 (L) 12/18/2022 1425   MCHC 30.0 (L) 12/18/2022 1425   RDW 14.5 12/18/2022 1425   RDW 14.5 07/02/2020 1625   LYMPHSABS 2,766 12/18/2022 1425   LYMPHSABS 3.2 (H) 07/02/2020 1625   EOSABS 38 12/18/2022 1425   EOSABS 0.1 07/02/2020 1625   BASOSABS 32 12/18/2022 1425   BASOSABS 0.0 07/02/2020 1625    Hgb A1C Lab Results  Component Value Date   HGBA1C 5.8 (H) 12/18/2022           Assessment & Plan:   Assessment and Plan    Lateral Epicondylitis (Tennis Elbow) Pain in the right elbow for 3-4 weeks, exacerbated by lifting and twisting. No relief with ibuprofen. Physical exam consistent with lateral epicondylitis. -Start Prednisone taper for 9 days. -Purchase and use a tennis elbow brace during work. -If no improvement, consider referral to orthopedics.   RTC in 4 months for your annual exam Nicki Reaper, NP

## 2023-01-29 NOTE — Patient Instructions (Signed)
Codo de [place] Tennis Elbow  El codo de tenista es la irritacin e hinchazn (inflamacin) en la zona externa del Alen Bleacher, cerca del codo. La hinchazn afecta los tejidos que conectan el msculo al hueso (tendones). El codo de [place] puede presentarse al Education administrator cualquier deporte o Education officer, environmental una tarea que exige el uso excesivo del codo. La causa del codo de [place] es la repeticin del mismo movimiento una y Laverda Page. Cules son las causas? Esta afeccin a menudo se produce por practicar deportes o realizar tareas en los que tiene que mover el antebrazo de la [place]. A veces, la afeccin puede deberse a una lesin repentina. Qu incrementa el riesgo? Tiene ms probabilidades de tener codo de tenista si juega al tenis o practica otro deporte con raqueta. Tambin tiene un mayor riesgo si [place] frecuentemente las manos para Printmaker. Esto puede comprender lo siguiente: Usuarios de computadoras. Trabajadores de Paramedic. Personas que trabajan en una fbrica. Msicos. Cocineros. Cajeros. Cules son los signos o sntomas? Dolor y sensibilidad a la palpacin en el Product manager y la parte externa del codo. Puede sentir dolor todo Museum/gallery conservator o solo cuando [place] el brazo. Sensacin de ardor. Esta empieza en el codo y se extiende por el brazo. Agarre dbil en la mano. Cmo se trata? Descansar el brazo y Contractor hielo es Designer, jewellery. El mdico tambin puede recomendarle: Medicamentos para reducir Chief Technology Officer y la hinchazn. Una correa para codo. Fisioterapia. Este tratamiento puede incluir masajes o ejercicios, o ambos. Un dispositivo ortopdico para el codo. Si estos no ayudan a que sus sntomas mejoren, el mdico puede recomendarle [place]. Siga estas instrucciones en su casa: Si tiene un dispositivo ortopdico o una correa: Use el dispositivo ortopdico o la correa como se lo haya indicado el mdico. Quteselos solamente como se lo haya indicado el mdico. Controle la piel  alrededor del dispositivo ortopdico o la correa todos [place]. Comunquese con su mdico si ve algn problema. Afljeselos si los dedos de la mano: [place]ormiguean. Se adormecen. Se tornan fros y de [employer]. Mantenga la correa o el dispositivo ortopdico limpio. Si el dispositivo ortopdico o la correa no son impermeables: No deje que se mojen. Cbralos con un envoltorio hermtico cuando tome un bao de inmersin o una ducha. Control del dolor, la rigidez y la hinchazn  Aplique hielo sobre la zona lesionada si se lo indican. Para hacer esto: Si tiene un dispositivo ortopdico o una correa desmontable, quteselo como se lo haya indicado el mdico. Ponga el hielo en una bolsa plstica. Coloque una toalla entre la piel y Copy. Aplique el hielo durante 20 minutos, 2 o 3 veces por da. Retire el hielo si la piel se le pone de color rojo brillante. Esto es Intel. Si no puede sentir dolor, calor o fro, tiene un mayor riesgo de que se dae la zona. Mueva los dedos con frecuencia. Actividad Descanse el codo y la [place]. Evite las actividades que pueden causar problemas en el codo, como se lo haya indicado el mdico. Haga los ejercicios como se lo haya indicado el mdico. Si levanta un objeto, hgalo con la palma de la mano [place] ar[place]. Estilo de vida Si el codo de [place] se debe a los deportes, revise el equipo y Manufacturing engineer de lo siguiente: Que lo est utilizando de forma correcta. Que es apto para usted. Si el codo de [place] se debe a su trabajo o al uso de una computadora, tmese descansos con frecuencia  para Sports coach. Hable con su gerente sobre cmo puede hacer que su afeccin mejore en el trabajo. Instrucciones generales [employer] de venta libre y los recetados solamente como se lo haya indicado el mdico. No fume ni consuma ningn producto que contenga nicotina o tabaco. Si necesita ayuda para dejar de consumir estos productos, consulte al  mdico. Cumpla con todas las visitas de seguimiento. Cmo se evita? Antes y despus de estar activo: Precaliente y elongue adecuadamente antes de hacer actividad fsica. Reljese y elongue despus de hacer actividad fsica. Dele al cuerpo tiempo para [employer]. Mientras est activo: Asegrese de usar el equipo que sea apto para usted. Si juega al tenis, dele potencia a su golpe con la parte inferior del cuerpo. Evite usar Chief Operating Officer. Mantenga un estado fsico adecuado. Esto puede comprender lo siguiente: [place]. Flexibilidad. Resistencia. Haga ejercicios para fortalecer los msculos del Product manager. Comunquese con un mdico si: El dolor no mejora con Scientist, research (medical). El dolor [place]. Tiene debilidad en el antebrazo, la mano o los dedos de la [place]. No puede sentir el antebrazo, la mano o los dedos de la [place]. Solicite ayuda de inmediato si: El dolor es muy intenso. No puede mover la [place]. Resumen El codo de [place] es la irritacin e hinchazn (inflamacin) en la zona externa del Alen Bleacher, cerca del codo. Su causa es la repeticin del mismo movimiento una y Laverda Page. Descanse el codo y la [place]. Evite las actividades de acuerdo con lo que le indique su mdico. Si se lo indican, aplique hielo sobre la zona lesionada durante 20 minutos, de 2 a 3 veces por da. Esta informacin no tiene Theme park manager el consejo del mdico. Asegrese de hacerle al mdico cualquier pregunta que tenga. Document Revised: 10/30/2019 Document Reviewed: 10/30/2019 Elsevier Patient Education  2024 [employer].

## 2023-02-24 ENCOUNTER — Encounter: Payer: Self-pay | Admitting: Gastroenterology

## 2023-03-03 ENCOUNTER — Telehealth: Payer: Self-pay | Admitting: Gastroenterology

## 2023-03-03 NOTE — Telephone Encounter (Signed)
Patient like to cancel her procedure.

## 2023-03-05 ENCOUNTER — Ambulatory Visit
Admission: RE | Admit: 2023-03-05 | Payer: BC Managed Care – PPO | Source: Ambulatory Visit | Admitting: Gastroenterology

## 2023-03-05 SURGERY — COLONOSCOPY WITH PROPOFOL
Anesthesia: General

## 2023-03-29 ENCOUNTER — Ambulatory Visit: Payer: BC Managed Care – PPO | Admitting: Internal Medicine

## 2023-03-29 ENCOUNTER — Encounter: Payer: Self-pay | Admitting: Internal Medicine

## 2023-03-29 VITALS — BP 118/72 | Ht 59.0 in | Wt 132.6 lb

## 2023-03-29 DIAGNOSIS — M7711 Lateral epicondylitis, right elbow: Secondary | ICD-10-CM

## 2023-03-29 MED ORDER — MELOXICAM 15 MG PO TABS: 15.0000 mg | ORAL_TABLET | Freq: Every day | ORAL | 0 refills | Status: AC

## 2023-03-29 NOTE — Patient Instructions (Signed)
Elbow and Forearm Exercises Ask your health care provider which exercises are safe for you. Do exercises exactly as told by your provider and adjust them as directed. It is normal to feel mild stretching, pulling, tightness, or discomfort as you do these exercises. Stop right away if you feel sudden pain or your pain gets worse. Do not begin these exercises until told by your provider. Range-of-motion exercises These exercises warm up your muscles and joints and improve the movement and flexibility of your injured elbow and forearm. The exercises also help to relieve pain, numbness, and tingling. These exercises are done using the muscles in your injured elbow and forearm (active). Elbow flexion, active  Hold your left / right arm at your side, and bend your elbow (flexion) as far as you can using only your arm muscles. Hold this position for __________ seconds. Slowly return to the starting position. Repeat __________ times. Complete this exercise __________ times a day. Elbow extension, active  Hold your left / right arm at your side, and straighten your elbow (extension) as much as you can using only your arm muscles. Hold this position for __________ seconds. Slowly return to the starting position. Repeat __________ times. Complete this exercise __________ times a day. Forearm rotation, supination This is an exercise in which you turn (rotate) your forearm palm-up (supination). Stand or sit with your elbows at your sides. Bend your left / right elbow to a 90-degree angle (right angle). Position your forearm so that the thumb is facing the ceiling (neutral position). Rotate your palm up toward the ceiling until you feel a gentle stretch on the inside of your forearm. If told, use your other hand to help rotate your forearm farther until you feel a gentle to moderate stretch. Hold this position for __________ seconds. Slowly return to the starting position. Repeat __________ times. Complete  this exercise __________ times a day. Forearm rotation, pronation This is an exercise in which you turn (rotate) your forearm palm-down (pronation). Stand or sit with your elbows at your sides. Bend your left / right elbow to a 90-degree angle (right angle). Position your forearm so that the thumb is facing the ceiling (neutral position). Rotate your palm down until you feel a gentle stretch on the top of your forearm. If told, use your other hand to help rotate your forearm farther until you feel a gentle to moderate stretch. Hold this position for __________ seconds. Slowly return to the starting position. Repeat __________ times. Complete this exercise __________ times a day. Stretching exercises These exercises warm up your muscles and joints and improve the movement and flexibility of your injured elbow and forearm. These exercises also help to relieve pain, numbness, and tingling. These exercises are done using your healthy elbow and forearm to help stretch the muscles in your injured elbow and forearm (active-assisted). Elbow flexion, active-assisted  Hold your left / right arm at your side, and bend your elbow (flexion) as much as you can using your left / right arm muscles. Use your other hand to bend your left / right elbow farther. To do this, gently push up on your forearm until you feel a gentle stretch on the back of your elbow. Hold this position for __________ seconds. Slowly return to the starting position. Repeat __________ times. Complete this exercise __________ times a day. Elbow extension, active-assisted  Hold your left / right arm at your side, and straighten your elbow (extension) as much as you can using your left / right  arm muscles. Use your other hand to straighten the left / right elbow farther. To do this, gently push down on your forearm until you feel a gentle stretch on the inside of your elbow. Hold this position for __________ seconds. Slowly return to the  starting position. Repeat __________ times. Complete this exercise __________ times a day. Passive elbow flexion, supine  Lie on your back (supine position). Extend your left / right arm up in the air, bracing it with your other hand. Let your left / right hand slowly lower toward your shoulder (passive flexion) while your elbow stays pointed toward the ceiling. You should feel a gentle stretch along the back of your upper arm and elbow. If told by your provider, you may add a small wrist weight or hand weight to increase the stretch. Hold this position for __________ seconds. Slowly return to the starting position. Repeat __________ times. Complete this exercise __________ times a day. Passive elbow extension, supine  Lie on your back (supine position). Make sure that you are in a comfortable position that lets you relax your arm muscles. Place a folded towel under your left / right upper arm so your elbow and shoulder are at the same height. Straighten your left / right arm so your elbow does not rest on the bed or towel. Let the weight of your hand stretch your elbow (passive extension). Keep your arm and chest muscles relaxed. You should feel a stretch on the inside of your elbow. If told by your provider, you may add a small wrist weight or hand weight to increase the stretch. Hold this position for __________ seconds. Slowly release the stretch. Repeat __________ times. Complete this exercise __________ times a day. Strengthening exercises These exercises build strength and endurance in your elbow and forearm. Endurance is the ability to use your muscles for a long time, even after they get tired. Elbow flexion, isometric  Stand or sit up straight. Bend your left / right elbow in a 90-degree angle (right angle). Keep your forearm at the height of your waist. Your thumb should be pointed toward the ceiling (neutral forearm). Place your other hand on top of your left / right forearm.  Gently push down while you resist with your left / right arm (isometric flexion). Push as hard as you can with both arms without causing any pain or movement at your left / right elbow. Hold this position for __________ seconds. Slowly release the tension in both arms. Let your muscles relax completely before you repeat the exercise. Repeat __________ times. Complete this exercise __________ times a day. Elbow extension, isometric  Stand or sit up straight. Place your left / right arm so your palm faces your abdomen and is at the height of your waist. Place your other hand on the underside of your left / right forearm. Gently push up while you resist with your left / right arm (isometric extension). Push as hard as you can with both arms without causing any pain or movement at your left / right elbow. Hold this position for __________ seconds. Slowly release the tension in both arms. Let your muscles relax completely before you repeat the exercise. Repeat __________ times. Complete this exercise __________ times a day. Elbow flexion with forearm palm up  Sit on a firm chair without armrests or stand up. Place your left / right arm at your side with your elbow straight and your palm facing forward. Holding a __________ weight or gripping a rubber exercise  band or tubing, bend your elbow to bring your hand toward your shoulder (flexion). Hold this position for __________ seconds. Slowly return to the starting position. Repeat __________ times. Complete this exercise __________ times a day. Elbow extension, active  Sit on a firm chair without armrests or stand up. Hold a rubber exercise band or tubing in both hands. Keeping your upper arms at your sides, bring both hands up to your left / right shoulder. Keep your left / right hand just below your other hand. Straighten your left / right elbow (extension) while keeping your other arm still. Hold this position for __________ seconds. Control  the resistance of the band or tubing as you return to the starting position. Repeat __________ times. Complete this exercise __________ times a day. Forearm rotation with weight, supination  Sit with your left / right forearm supported on a table. Your elbow should be at waist height and bent at a 90-degree angle (right angle). Gently grasp a lightweight hammer. Rest your hand over the edge of the table with your palm facing down. Without moving your left / right elbow, slowly rotate your forearm to turn your palm up toward the ceiling (supination). Hold this position for __________ seconds. Slowly return to the starting position. Repeat __________ times. Complete this exercise __________ times a day. Forearm rotation with weight, pronation  Sit with your left / right forearm supported on a table. Keep your elbow below shoulder height. Gently grasp a lightweight hammer. Rest your hand over the edge of the table with your palm facing up. Without moving your left / right elbow, slowly rotate your forearm to turn your palm down toward the floor (pronation). Hold this position for __________ seconds. Slowly return to the starting position. Repeat __________ times. Complete this exercise __________ times a day. This information is not intended to replace advice given to you by your health care provider. Make sure you discuss any questions you have with your health care provider. Document Revised: 08/03/2022 Document Reviewed: 12/26/2021 Elsevier Patient Education  2024 [employer].

## 2023-03-29 NOTE — Progress Notes (Signed)
Subjective:    Patient ID: Kelly Humphrey, female    DOB: Jul 27, 1975, 47 y.o.   MRN: [id number]  HPI  Discussed the use of AI scribe software for clinical note transcription with the patient, who gave verbal consent to proceed.  The patient has been experiencing right elbow pain for the past two to three months. The pain is described as dull and achy, similar to a toothache, and is exacerbated by heavy use and lifting. There is no associated swelling, numbness, or tingling. Previous attempts to manage the pain with prednisone, ice, and a brace have been unsuccessful. The patient has also been taking ibuprofen, which provides minimal relief.     Review of Systems     No past medical history on file.  Current Outpatient Medications  Medication Sig Dispense Refill   Ferrous Sulfate (IRON PO) Take by mouth.     predniSONE (DELTASONE) 10 MG tablet Take 3 tabs on days 1-3, 2 tabs on days 4-6, 1 tab on days 7-9 (Patient not taking: Reported on 02/24/2023) 18 tablet 0   No current facility-administered medications for this visit.    No Known Allergies  Family History  Problem Relation Age of Onset   Heart attack Father    Breast cancer Sister     Social History   Socioeconomic History   Marital status: Married    Spouse name: Not on file   Number of children: Not on file   Years of education: Not on file   Highest education level: Not on file  Occupational History   Not on file  Tobacco Use   Smoking status: Never   Smokeless tobacco: Never  Vaping Use   Vaping status: Never Used  Substance and Sexual Activity   Alcohol use: No   Drug use: No   Sexual activity: Yes    Birth control/protection: Post-menopausal  Other Topics Concern   Not on file  Social History Narrative   Not on file   Social Drivers of Health   Financial Resource Strain: Not on file  Food Insecurity: Not on file  Transportation Needs: Not on file  Physical Activity: Not on file   Stress: Not on file  Social Connections: Not on file  Intimate Partner Violence: Not on file     Constitutional: Denies fever, malaise, fatigue, headache or abrupt weight changes.  Respiratory: Denies difficulty breathing, shortness of breath, cough or sputum production.   Cardiovascular: Denies chest pain, chest tightness, palpitations or swelling in the hands or feet.  Gastrointestinal: Denies abdominal pain, bloating, constipation, diarrhea or blood in the stool.  GU: Denies urgency, frequency, pain with urination, burning sensation, blood in urine, odor or discharge. Musculoskeletal: Patient reports right elbow pain.  Denies decrease in range of motion, difficulty with gait, muscle pain or joint swelling.  Skin: Denies redness, rashes, lesions or ulcercations.  Neurological: Denies dizziness, difficulty with memory, difficulty with speech or problems with balance and coordination.  Psych: Denies anxiety, depression, SI/HI.  No other specific complaints in a complete review of systems (except as listed in HPI above).  Objective:   Physical Exam   BP 118/72 (BP Location: Right Arm, Patient Position: Sitting, Cuff Size: Normal)   Ht 4\' 11"  (1.499 m)   Wt 132 lb 9.6 oz (60.1 kg)   BMI 26.78 kg/m    Wt Readings from Last 3 Encounters:  01/29/23 134 lb (60.8 kg)  12/18/22 136 lb (61.7 kg)  06/18/22 132 lb (59.9 kg)  General: Appears her stated age, overweight, in NAD. Skin: Warm, dry and intact. Cardiovascular: Normal rate and rhythm. S1,S2 noted.  No murmur, rubs or gallops noted.  Radial pulse 2+ on the right. Pulmonary/Chest: Normal effort and positive vesicular breath sounds. No respiratory distress. No wheezes, rales or ronchi noted.  Musculoskeletal: Normal flexion, extension and rotation of the right elbow.  No joint swelling noted.  Pain with palpation over the lateral epicondyle and proximal common extensor tendon.  Strength 5/5 BUE.  Handgrips equal. Neurological:  Alert and oriented. Coordination normal of the right hand.    BMET    Component Value Date/Time   NA 140 12/18/2022 1425   NA 144 07/02/2020 1625   K 4.1 12/18/2022 1425   CL 106 12/18/2022 1425   CO2 27 12/18/2022 1425   GLUCOSE 87 12/18/2022 1425   BUN 18 12/18/2022 1425   BUN 12 07/02/2020 1625   CREATININE 0.64 12/18/2022 1425   CALCIUM 8.9 12/18/2022 1425   GFRNONAA >60 12/26/2021 1503    Lipid Panel     Component Value Date/Time   CHOL 204 (H) 12/18/2022 1425   TRIG 83 12/18/2022 1425   HDL 58 12/18/2022 1425   CHOLHDL 3.5 12/18/2022 1425   LDLCALC 128 (H) 12/18/2022 1425    CBC    Component Value Date/Time   WBC 6.3 12/18/2022 1425   RBC 4.22 12/18/2022 1425   HGB 10.1 (L) 12/18/2022 1425   HGB 11.8 07/02/2020 1625   HCT 33.7 (L) 12/18/2022 1425   HCT 37.2 07/02/2020 1625   PLT 255 12/18/2022 1425   MCV 79.9 (L) 12/18/2022 1425   MCV 81 07/02/2020 1625   MCH 23.9 (L) 12/18/2022 1425   MCHC 30.0 (L) 12/18/2022 1425   RDW 14.5 12/18/2022 1425   RDW 14.5 07/02/2020 1625   LYMPHSABS 2,766 12/18/2022 1425   LYMPHSABS 3.2 (H) 07/02/2020 1625   EOSABS 38 12/18/2022 1425   EOSABS 0.1 07/02/2020 1625   BASOSABS 32 12/18/2022 1425   BASOSABS 0.0 07/02/2020 1625    Hgb A1C Lab Results  Component Value Date   HGBA1C 5.8 (H) 12/18/2022           Assessment & Plan:   Assessment and Plan    Assessment and Plan    Lateral epicondylitis Chronic, dull, and achy pain exacerbated by heavy lifting. No relief with prednisone, ice, or brace. Tenderness over the tendon and part of the elbow. No prior imaging. -Refer to orthopedics for further evaluation and imaging. -Discontinue ibuprofen and start Meloxicam 15mg  daily for pain control. -Permit use of Tylenol as needed for additional pain relief.      RTC in 3 months for your annual exam Nicki Reaper, NP

## 2023-04-01 DIAGNOSIS — M7711 Lateral epicondylitis, right elbow: Secondary | ICD-10-CM | POA: Diagnosis not present

## 2023-06-25 ENCOUNTER — Encounter: Payer: Self-pay | Admitting: Internal Medicine

## 2023-09-09 ENCOUNTER — Other Ambulatory Visit (HOSPITAL_COMMUNITY): Payer: Self-pay | Admitting: Internal Medicine

## 2023-09-09 DIAGNOSIS — Z1231 Encounter for screening mammogram for malignant neoplasm of breast: Secondary | ICD-10-CM

## 2023-09-22 ENCOUNTER — Encounter (HOSPITAL_COMMUNITY)

## 2023-09-22 DIAGNOSIS — Z1231 Encounter for screening mammogram for malignant neoplasm of breast: Secondary | ICD-10-CM

## 2023-10-29 ENCOUNTER — Ambulatory Visit (HOSPITAL_COMMUNITY)
Admission: RE | Admit: 2023-10-29 | Discharge: 2023-10-29 | Disposition: A | Discharge status: Home / Self Care | Source: Ambulatory Visit | Attending: Internal Medicine | Admitting: Internal Medicine

## 2023-10-29 DIAGNOSIS — Z1231 Encounter for screening mammogram for malignant neoplasm of breast: Secondary | ICD-10-CM | POA: Diagnosis not present

## 2023-11-18 ENCOUNTER — Ambulatory Visit: Admitting: Advanced Practice Midwife

## 2023-11-18 ENCOUNTER — Other Ambulatory Visit (HOSPITAL_COMMUNITY)
Admission: RE | Admit: 2023-11-18 | Discharge: 2023-11-18 | Disposition: A | Discharge status: Home / Self Care | Source: Ambulatory Visit | Attending: Advanced Practice Midwife | Admitting: Advanced Practice Midwife

## 2023-11-18 ENCOUNTER — Encounter: Payer: Self-pay | Admitting: Advanced Practice Midwife

## 2023-11-18 VITALS — BP 130/86 | HR 69 | Resp 16 | Ht 59.0 in | Wt 132.6 lb

## 2023-11-18 DIAGNOSIS — Z01419 Encounter for gynecological examination (general) (routine) without abnormal findings: Secondary | ICD-10-CM

## 2023-11-18 DIAGNOSIS — Z124 Encounter for screening for malignant neoplasm of cervix: Secondary | ICD-10-CM | POA: Diagnosis not present

## 2023-11-18 NOTE — Progress Notes (Signed)
 Gynecology Annual Exam  PCP: Antonette Angeline ORN, NP  Chief Complaint:  Chief Complaint  Patient presents with   Annual Exam    History of Present Illness: Patient is a 48 y.o. G2P2002 presents for annual exam. The patient has complaint today of pain with sexual activity due to dryness. We discussed the various treatments including over the counter, lifestyle and hormonal/medications. She prefers to start with low intervention- coconut oil.   LMP: No LMP recorded. Patient is postmenopausal. Last period was 5-6 years ago per her report.  The patient is sexually active. She currently uses post menopausal status for contraception. She denies dyspareunia.  The patient does perform self breast exams.  There is no notable family history of breast or ovarian cancer in her family.  The patient wears seatbelts: yes.   The patient has regular exercise: she walks regularly, admits healthy diet, hydration and sleep.    The patient denies current symptoms of depression.    Review of Systems: Review of Systems  Constitutional:  Negative for chills and fever.  HENT:  Negative for congestion, ear discharge, ear pain, hearing loss, sinus pain and sore throat.   Eyes:  Negative for blurred vision and double vision.  Respiratory:  Negative for cough, shortness of breath and wheezing.   Cardiovascular:  Negative for chest pain, palpitations and leg swelling.  Gastrointestinal:  Negative for abdominal pain, blood in stool, constipation, diarrhea, heartburn, melena, nausea and vomiting.  Genitourinary:  Negative for dysuria, flank pain, frequency, hematuria and urgency.       Positive for vaginal dryness  Musculoskeletal:  Negative for back pain, joint pain and myalgias.  Skin:  Negative for itching and rash.  Neurological:  Negative for dizziness, tingling, tremors, sensory change, speech change, focal weakness, seizures, loss of consciousness, weakness and headaches.  Endo/Heme/Allergies:  Negative for  environmental allergies. Does not bruise/bleed easily.  Psychiatric/Behavioral:  Negative for depression, hallucinations, memory loss, substance abuse and suicidal ideas. The patient is not nervous/anxious and does not have insomnia.     Past Medical History:  Patient Active Problem List   Diagnosis Date Noted   Prediabetes 12/18/2022   Pure hypercholesterolemia 12/18/2022   Iron deficiency anemia due to chronic blood loss 12/18/2022   Overweight with body mass index (BMI) of 27 to 27.9 in adult 01/30/2021    Past Surgical History:  Past Surgical History:  Procedure Laterality Date   COLONOSCOPY WITH PROPOFOL  N/A 07/31/2022   Procedure: COLONOSCOPY WITH PROPOFOL ;  Surgeon: Therisa Bi, MD;  Location: [facility] ENDOSCOPY;  Service: Gastroenterology;  Laterality: N/A;   NO PAST SURGERIES      Gynecologic History:  No LMP recorded. Patient is postmenopausal.  Last Pap: 2022 Results were:  NIL and HR HPV negative  Last mammogram: 11/02/2023 Results were: BI-RAD I  Obstetric History: [id number]  Family History:  Family History  Problem Relation Age of Onset   Heart attack Father    Breast cancer Sister     Social History:  Social History   Socioeconomic History   Marital status: Married    Spouse name: Not on file   Number of children: Not on file   Years of education: Not on file   Highest education level: Not on file  Occupational History   Not on file  Tobacco Use   Smoking status: Never   Smokeless tobacco: Never  Vaping Use   Vaping status: Never Used  Substance and Sexual Activity   Alcohol use: No  Drug use: No   Sexual activity: Yes    Birth control/protection: Post-menopausal  Other Topics Concern   Not on file  Social History Narrative   Not on file   Social Drivers of Health   Financial Resource Strain: Not on file  Food Insecurity: Not on file  Transportation Needs: Not on file  Physical Activity: Not on file  Stress: Not on file  Social Connections:  Not on file  Intimate Partner Violence: Not on file    Allergies:  No Known Allergies  Medications: Prior to Admission medications   Medication Sig Start Date End Date Taking? Authorizing Provider  Ferrous Sulfate (IRON PO) Take by mouth.   Yes [provider]  meloxicam  (MOBIC ) 15 MG tablet Take 1 tablet (15 mg total) by mouth daily. 03/29/23  Yes Antonette Angeline ORN, NP    Physical Exam Vitals: BP 130/86   Pulse 69   Resp 16   Ht 4' 11 (1.499 m)   Wt 132 lb 9.6 oz (60.1 kg)   BMI 26.78 kg/m   General: NAD HEENT: normocephalic, anicteric Thyroid : no enlargement, no palpable nodules Pulmonary: No increased work of breathing, CTAB Cardiovascular: RRR, distal pulses 2+ Breast: Breast symmetrical, no tenderness, no palpable nodules or masses, no skin or nipple retraction present, no nipple discharge.  No axillary or supraclavicular lymphadenopathy. Abdomen: NABS, soft, non-tender, non-distended.  Umbilicus without lesions.  No hepatomegaly, splenomegaly or masses palpable. No evidence of hernia  Genitourinary:  External: Normal external female genitalia.  Normal urethral meatus, normal Bartholin's and Skene's glands.    Vagina: thinning/atrophy of vaginal mucosa, no evidence of prolapse.    Cervix: Grossly normal in appearance, no bleeding, Os tightly closed  Uterus: Non-enlarged, mobile, normal contour.  No CMT  Adnexa: ovaries non-enlarged, no adnexal masses  Rectal: deferred  Lymphatic: no evidence of inguinal lymphadenopathy Extremities: no edema, erythema, or tenderness Neurologic: Grossly intact Psychiatric: mood appropriate, affect full   Assessment: 48 y.o. G2P2002 routine annual exam  Plan: Problem List Items Addressed This Visit   None Visit Diagnoses       Well woman exam    -  Primary   Relevant Orders   Cytology - PAP     Cervical cancer screening       Relevant Orders   Cytology - PAP       1) Mammogram - recommend yearly screening  mammogram.  Mammogram Is up to date  2) STI screening  was offered and declined  3) ASCCP guidelines and rationale discussed.  Patient opts for every 5 years screening interval  4) Contraception - the patient is currently using  post menopausal status.    5) Colonoscopy: to be scheduled per PCP -- Screening recommended starting at age 10 for average risk individuals, age 18 for individuals deemed at increased risk (including African Americans). Continue until age 100.  For patient age 7-85 individualized approach is recommended.  Gold standard screening is via colonoscopy, Cologuard screening is an acceptable alternative for patient unwilling or unable to undergo colonoscopy.  Colorectal cancer screening for average?risk adults: 2018 guideline update from the American Cancer SocietyCA: A Cancer Journal for Clinicians: Aug 26, 2016   6) Routine healthcare maintenance including cholesterol, diabetes screening discussed managed by PCP  7) Return in about 1 year (around 11/17/2024) for annual established gyn.   Slater Rains, CNM [facility] 11/18/2023 5:14 PM

## 2023-11-18 NOTE — Patient Instructions (Signed)
 Menopausia: qu debe saber Menopause: What to Know La menopausia es el momento de la vida de una mujer en el que los perodos menstruales se detienen. Marca el final de su capacidad para quedar embarazada. Se puede definir como no tener el perodo durante 12 meses sin otra causa mdica. La etapa en que comienza a pasar a la menopausia se llama perimenopausia. Suele ocurrir [employer] 45 y los 55 aos. Puede durar [employer]. Durante la perimenopausia, los niveles hormonales del cuerpo [place]. Esto puede causar sntomas y Audiological scientist a Radiographer, therapeutic. La menopausia puede hacer que sea ms propensa a tener lo siguiente: [employer], que se quiebran con ms facilidad. Depresin. Esta ocurre cuando se siente tristeza o desesperanza. Endurecimiento y Scientist, research (medical) de las arterias. Estos pueden causar infarto de miocardio y accidente cerebrovascular. Cules son las causas? En la [employer], la menopausia es un cambio natural del cuerpo y los niveles hormonales que ocurre con el paso del [place]. Pero en algunos casos, puede deberse a cambios que no son naturales. Incluyen lo siguiente: [place] para [employer]. Efectos secundarios de algunos medicamentos. Qu incrementa el riesgo? Es ms probable que pase por la menopausia de forma temprana si: Tiene un crecimiento anormal (tumor) de la hipfisis en el cerebro. Tiene una enfermedad que [employer] ovarios. Recibe determinados tratamientos para el cncer. Incluyen lo siguiente: Quimioterapia. Terapia hormonal. Radioterapia en la zona que est entre las caderas (pelvis). Fuma mucho o bebe mucho alcohol. Otras personas de su familia han pasado por la menopausia de forma temprana. Es muy delgada. Cules son los signos o sntomas? Es posible que tenga: [place]. Perodos irregulares. Sudoracin nocturna. Cambios en cmo se siente con respecto al sexo. Usted puede: Tener menos deseo sexual. Sentir ms incomodidad  en torno a su sexualidad. Sequedad vaginal y adelgazamiento de las paredes de la vagina. Esto puede hacer que tener sexo le genere dolor. Cambios en la piel, por ejemplo: Piel seca. Arrugas nuevas. Dolores de [place]. Otros sntomas pueden incluir: Dificultad para dormir. Cambios en el estado de nimo. Problemas de memoria. Aumento de [place]. Crecimiento de vello en la cara y el pecho. Infecciones en la vejiga o dificultad para orinar. Cmo se diagnostica? Pueden diagnosticarle esta afeccin en funcin de lo siguiente: Sus antecedentes mdicos. Un examen. Su edad. Sus antecedentes de [employer]. Sus sntomas. Pruebas hormonales. Cmo se trata? En algunos los casos, no se necesita tratamiento. Hable con su mdico acerca de si debera recibir algn tratamiento. Los tratamientos pueden incluir lo siguiente: Terapia hormonal para la menopausia (THM). Medicamentos para tratar ciertos sntomas. Acupuntura. Vitaminas o suplementos herbales. Antes de Microbiologist, informe al mdico si usted o alguien de su familia tiene o ha tenido: Enfermedad cardaca. Cncer de mama. Cogulos de [place]. Diabetes. Osteoporosis. Siga estas instrucciones en su casa: Comida y bebida  Consuma una dieta equilibrada. Debe incluir: Nils Pyle y verduras frescas. Cereales integrales. Protenas magras. Lcteos con bajo contenido de [place]. Consuma muchos alimentos que contengan calcio y vitamina D. Estos pueden ayudar a [employer]. Los alimentos y bebidas con alto contenido de calcio y hierro son, Eusebio Me otros: [place]ogur y [employer]. Frijoles. Almendras. Sardinas. Brcoli y col rizada. Para ayudar a prevenir los acaloramientos, no consuma lo siguiente: Alcohol. Bebidas con cafena. Comidas condimentadas. Estilo de vida No fume, vapee ni consuma nicotina o tabaco. Intente dormir de 7 a 8 horas todas las noches. Si tiene acaloramientos, es recomendable que: Se  vista  con capas de ropa. Evite las cosas que podran [employer] acaloramientos, como los lugares calientes o el estrs. Respire profundamente y despacio cuando comience a Scientist, water quality. Tenga un ventilador en su casa y en su oficina. Busque maneras de Charity fundraiser. Tal vez deba intentar lo siguiente: Respiracin profunda. Meditacin. Escribir un diario. Consulte al [employer] terapia grupal. La terapia puede ayudarla a obtener apoyo de otras personas que estn pasando por la menopausia. Instrucciones generales  Hable con su mdico antes de tomar cualquier suplemento a base de hierbas. Lleve un registro de los sntomas. Registre: Cundo comienzan. Con qu frecuencia los tiene. Cunto tiempo duran. Use lubricantes o humectantes vaginales. Estos pueden ayudar con: Sequedad vaginal. La comodidad durante el sexo. Comunquese con un mdico si: Es mayor de 84 aos y an tiene Environmental manager. Siente dolor durante el sexo. No ha tenido un perodo menstrual durante 12 meses y luego comienza a Geophysicist/field seismologist por la vagina. Siente dolor al [employer]oco[employer]. Tiene dolores de [place] muy intensos. Solicite ayuda de inmediato si: Est muy deprimida. Tiene mucho sangrado de la vagina. [employer] late [place] rpido. Siente un dolor muy intenso en el vientre o indigestin que no se alivia con medicamentos. Esta informacin no tiene Theme park manager el consejo del mdico. Asegrese de hacerle al mdico cualquier pregunta que tenga. Document Revised: 12/24/2022 Document Reviewed: 12/24/2022 Elsevier Patient Education  2024 [employer].

## 2023-11-24 LAB — CYTOLOGY - PAP
Comment: NEGATIVE
Comment: NEGATIVE
Comment: NEGATIVE
Diagnosis: UNDETERMINED — AB
HPV 16: POSITIVE — AB
HPV 18 / 45: NEGATIVE
High risk HPV: POSITIVE — AB

## 2023-11-29 ENCOUNTER — Ambulatory Visit: Payer: Self-pay | Admitting: Advanced Practice Midwife

## 2023-12-03 NOTE — Progress Notes (Signed)
   Referring Provider:  Slater Rains, CNM  HPI:  Kelly Humphrey is a 48 y.o.  [phone]  who presents today for evaluation and management of abnormal cervical cytology.    Prior pap smears:  Date:11/18/23 Eje:JDRLD  HPV: +16 (18/45 neg)  Date:07/02/20 Eje:WPOF        HPV: negative  Prior cervical / vaginal findings: unsure   Prior cervical treatment(s): thinks she had freezing over 25yrs ago  Symptoms/History:  -Abnormal vaginal discharge: no -Postmenopausal: yes -Intermenstrual bleeding: no -Postcoital bleeding: no -Bleeding problems (non-gyn): no -Contraception: postmenopausal  -Number of current sexual partners: 1 -Number of partners in lifetime: 1 -History of a high risk partner: no -History of STDs: no -Smoking: no -Gardasil Vaccine: no      ROS:  Pertinent items are noted in HPI.  OB History  Gravida Para Term Preterm AB Living  2 2 2   2   SAB IAB Ectopic Multiple Live Births      2    # Outcome Date GA Lbr Len/2nd Weight Sex Type Anes PTL Lv  2 Term           1 Term             History reviewed. No pertinent past medical history.  Past Surgical History:  Procedure Laterality Date   COLONOSCOPY WITH PROPOFOL  N/A 07/31/2022   Procedure: COLONOSCOPY WITH PROPOFOL ;  Surgeon: Therisa Bi, MD;  Location: [facility] ENDOSCOPY;  Service: Gastroenterology;  Laterality: N/A;   NO PAST SURGERIES      SOCIAL HISTORY:  Social History   Substance and Sexual Activity  Alcohol Use No    Social History   Substance and Sexual Activity  Drug Use No     Family History  Problem Relation Age of Onset   Heart attack Father    Breast cancer Sister     ALLERGIES:  Patient has no known allergies.  She has a current medication list which includes the following prescription(s): estradiol , ferrous sulfate, fluticasone, meloxicam , and na sulfate-k sulfate-mg sulfate concentrate.  Physical Exam: -Vitals:  BP 112/78   Pulse 99   Ht 4' 11 (1.499 m)   Wt 132  lb (59.9 kg)   BMI 26.66 kg/m   Physical Exam Vitals and nursing note reviewed. Exam conducted with a chaperone present.  Constitutional:      Appearance: Normal appearance.  HENT:     Head: Normocephalic and atraumatic.  Eyes:     Extraocular Movements: Extraocular movements intact.  Pulmonary:     Effort: Pulmonary effort is normal.  Genitourinary:    General: Normal vulva.     Vagina: Tenderness (atrophic mucosa) present.     Comments: Cervix is severely stenotic, unable to pass 1mm dilator and very painful, pt requesting to stop.  Neurological:     General: No focal deficit present.     Mental Status: She is alert.  Psychiatric:        Mood and Affect: Mood normal.     ASSESSMENT:  Kelly Humphrey is a 48 y.o. [id number], here for colposcopy, but on SSE, cervix is severely stenotic and unable to pass 1mm dilator, and would therefore be unable to obtain ECC.  -Pt to use Estrace  nightly for 14 days, nightly -Return for colposcopy in 2wks   Estil Mangle, DO [facility]

## 2023-12-06 ENCOUNTER — Ambulatory Visit: Admitting: Obstetrics

## 2023-12-06 ENCOUNTER — Encounter: Payer: Self-pay | Admitting: Obstetrics

## 2023-12-06 VITALS — BP 112/78 | HR 99 | Ht 59.0 in | Wt 132.0 lb

## 2023-12-06 DIAGNOSIS — N882 Stricture and stenosis of cervix uteri: Secondary | ICD-10-CM | POA: Diagnosis not present

## 2023-12-06 DIAGNOSIS — R87610 Atypical squamous cells of undetermined significance on cytologic smear of cervix (ASC-US): Secondary | ICD-10-CM | POA: Insufficient documentation

## 2023-12-06 DIAGNOSIS — R87810 Cervical high risk human papillomavirus (HPV) DNA test positive: Secondary | ICD-10-CM | POA: Insufficient documentation

## 2023-12-06 MED ORDER — ESTRADIOL 0.1 MG/GM VA CREA: 1.0000 | TOPICAL_CREAM | Freq: Every day | VAGINAL | 0 refills | Status: AC

## 2023-12-23 NOTE — Progress Notes (Signed)
 GYNECOLOGY PREOPERATIVE HISTORY AND PHYSICAL   Subjective:  Kelly Humphrey is a 48 y.o. [phone] here for surgical management of abnormal pap smear with severe cervical stenosis.   Indications for procedure include: unable to dilate cervix in office for colposcopy. No significant preoperative concerns.  Proposed surgery: cold knife cone biopsy  Pertinent Gynecological History: Menses: post-menopausal since 2020 Bleeding: none Contraception: post-menopausal Last mammogram: normal Date: 10/29/23 Last pap: abnormal: ASC-US , HPV 16 positive Date: 11/18/23  No past medical history on file. Past Surgical History:  Procedure Laterality Date   COLONOSCOPY WITH PROPOFOL  N/A 07/31/2022   Procedure: COLONOSCOPY WITH PROPOFOL ;  Surgeon: Therisa Bi, MD;  Location: [facility] ENDOSCOPY;  Service: Gastroenterology;  Laterality: N/A;   NO PAST SURGERIES     OB History  Gravida Para Term Preterm AB Living  2 2 2   2   SAB IAB Ectopic Multiple Live Births      2    # Outcome Date GA Lbr Len/2nd Weight Sex Type Anes PTL Lv  2 Term           1 Term           Patient denies any other pertinent gynecologic issues.  Family History  Problem Relation Age of Onset   Heart attack Father    Breast cancer Sister    Social History   Socioeconomic History   Marital status: Married    Spouse name: Not on file   Number of children: Not on file   Years of education: Not on file   Highest education level: Not on file  Occupational History   Not on file  Tobacco Use   Smoking status: Never   Smokeless tobacco: Never  Vaping Use   Vaping status: Never Used  Substance and Sexual Activity   Alcohol use: No   Drug use: No   Sexual activity: Yes    Birth control/protection: Post-menopausal  Other Topics Concern   Not on file  Social History Narrative   Not on file   Social Drivers of Health   Financial Resource Strain: Not on file  Food Insecurity: Not on file  Transportation Needs: Not on  file  Physical Activity: Not on file  Stress: Not on file  Social Connections: Not on file  Intimate Partner Violence: Not on file   Current Outpatient Medications on File Prior to Visit  Medication Sig Dispense Refill   Ferrous Sulfate (IRON PO) Take by mouth.     fluticasone (FLONASE) 50 MCG/ACT nasal spray SPRAY 2 SPRAYS INTO EACH NOSTRIL EVERY DAY (Patient not taking: Reported on 12/28/2023)     meloxicam  (MOBIC ) 15 MG tablet Take 1 tablet (15 mg total) by mouth daily. (Patient not taking: Reported on 12/28/2023) 90 tablet 0   Na Sulfate-K Sulfate-Mg Sulfate concentrate (SUPREP) 17.5-3.13-1.6 GM/177ML SOLN See admin instructions. (Patient not taking: Reported on 12/28/2023)     No current facility-administered medications on file prior to visit.   No Known Allergies    Review of Systems Constitutional: No recent fever/chills/sweats Respiratory: No recent cough/bronchitis Cardiovascular: No chest pain Gastrointestinal: No recent nausea/vomiting/diarrhea Genitourinary: No UTI symptoms Hematologic/lymphatic:No history of coagulopathy or recent blood thinner use    Objective:   Blood pressure 119/81, pulse 71, weight 131 lb 4.8 oz (59.6 kg). CONSTITUTIONAL: Well-developed, well-nourished female in no acute distress.  HENT:  Normocephalic, atraumatic, External right and left ear normal. Oropharynx is clear and moist EYES: Conjunctivae and EOM are normal. Pupils are equal, round,  and reactive to light. No scleral icterus.  NECK: Normal range of motion, supple, no masses SKIN: Skin is warm and dry. No rash noted. Not diaphoretic. No erythema. No pallor. NEUROLOGIC: Alert and oriented to person, place, and time. Normal reflexes, muscle tone coordination. No cranial nerve deficit noted. PSYCHIATRIC: Normal mood and affect. Normal behavior. Normal judgment and thought content. CARDIOVASCULAR: Normal heart rate noted, regular rhythm RESPIRATORY: Effort and breath sounds normal, no  problems with respiration noted ABDOMEN: Soft, nontender, nondistended. PELVIC: Unable to dilate cervix today with lacrimal duct probes MUSCULOSKELETAL: Normal range of motion. No edema and no tenderness. 2+ distal pulses.  Labs: No results found for this or any previous visit (from the past 2 weeks).   Imaging Studies: No results found.  Assessment:     48 y.o. [id number] with ASCUS/HPV 16+ on pap 11/18/23, unable to perform adequate colposcopy due to severe cervical stenosis. Discussed in-office vs same-day surgery for a cone biopsy and pt desires same-day surgery.       Plan:   Counseling: Procedure, risks, reasons, benefits and complications (including injury to uterus, major blood vessel, bleeding, possibility of transfusion, infection, or inadequate sampling) reviewed in detail. Likelihood of success in alleviating the patient's condition was discussed. Routine postoperative instructions will be reviewed with the patient and her family in detail after surgery.  The patient concurred with the proposed plan, giving informed written consent for the surgery.   Pt requesting 01/10/24 Preop testing ordered. Instructions reviewed, including NPO after midnight.   Estil Mangle, DO [facility]

## 2023-12-23 NOTE — H&P (View-Only) (Signed)
 GYNECOLOGY PREOPERATIVE HISTORY AND PHYSICAL   Subjective:  Kelly Humphrey is a 48 y.o. [phone] here for surgical management of abnormal pap smear with severe cervical stenosis.   Indications for procedure include: unable to dilate cervix in office for colposcopy. No significant preoperative concerns.  Proposed surgery: cold knife cone biopsy  Pertinent Gynecological History: Menses: post-menopausal since 2020 Bleeding: none Contraception: post-menopausal Last mammogram: normal Date: 10/29/23 Last pap: abnormal: ASC-US , HPV 16 positive Date: 11/18/23  No past medical history on file. Past Surgical History:  Procedure Laterality Date   COLONOSCOPY WITH PROPOFOL  N/A 07/31/2022   Procedure: COLONOSCOPY WITH PROPOFOL ;  Surgeon: Therisa Bi, MD;  Location: [facility] ENDOSCOPY;  Service: Gastroenterology;  Laterality: N/A;   NO PAST SURGERIES     OB History  Gravida Para Term Preterm AB Living  2 2 2   2   SAB IAB Ectopic Multiple Live Births      2    # Outcome Date GA Lbr Len/2nd Weight Sex Type Anes PTL Lv  2 Term           1 Term           Patient denies any other pertinent gynecologic issues.  Family History  Problem Relation Age of Onset   Heart attack Father    Breast cancer Sister    Social History   Socioeconomic History   Marital status: Married    Spouse name: Not on file   Number of children: Not on file   Years of education: Not on file   Highest education level: Not on file  Occupational History   Not on file  Tobacco Use   Smoking status: Never   Smokeless tobacco: Never  Vaping Use   Vaping status: Never Used  Substance and Sexual Activity   Alcohol use: No   Drug use: No   Sexual activity: Yes    Birth control/protection: Post-menopausal  Other Topics Concern   Not on file  Social History Narrative   Not on file   Social Drivers of Health   Financial Resource Strain: Not on file  Food Insecurity: Not on file  Transportation Needs: Not on  file  Physical Activity: Not on file  Stress: Not on file  Social Connections: Not on file  Intimate Partner Violence: Not on file   Current Outpatient Medications on File Prior to Visit  Medication Sig Dispense Refill   Ferrous Sulfate (IRON PO) Take by mouth.     fluticasone (FLONASE) 50 MCG/ACT nasal spray SPRAY 2 SPRAYS INTO EACH NOSTRIL EVERY DAY (Patient not taking: Reported on 12/28/2023)     meloxicam  (MOBIC ) 15 MG tablet Take 1 tablet (15 mg total) by mouth daily. (Patient not taking: Reported on 12/28/2023) 90 tablet 0   Na Sulfate-K Sulfate-Mg Sulfate concentrate (SUPREP) 17.5-3.13-1.6 GM/177ML SOLN See admin instructions. (Patient not taking: Reported on 12/28/2023)     No current facility-administered medications on file prior to visit.   No Known Allergies    Review of Systems Constitutional: No recent fever/chills/sweats Respiratory: No recent cough/bronchitis Cardiovascular: No chest pain Gastrointestinal: No recent nausea/vomiting/diarrhea Genitourinary: No UTI symptoms Hematologic/lymphatic:No history of coagulopathy or recent blood thinner use    Objective:   Blood pressure 119/81, pulse 71, weight 131 lb 4.8 oz (59.6 kg). CONSTITUTIONAL: Well-developed, well-nourished female in no acute distress.  HENT:  Normocephalic, atraumatic, External right and left ear normal. Oropharynx is clear and moist EYES: Conjunctivae and EOM are normal. Pupils are equal, round,  and reactive to light. No scleral icterus.  NECK: Normal range of motion, supple, no masses SKIN: Skin is warm and dry. No rash noted. Not diaphoretic. No erythema. No pallor. NEUROLOGIC: Alert and oriented to person, place, and time. Normal reflexes, muscle tone coordination. No cranial nerve deficit noted. PSYCHIATRIC: Normal mood and affect. Normal behavior. Normal judgment and thought content. CARDIOVASCULAR: Normal heart rate noted, regular rhythm RESPIRATORY: Effort and breath sounds normal, no  problems with respiration noted ABDOMEN: Soft, nontender, nondistended. PELVIC: Unable to dilate cervix today with lacrimal duct probes MUSCULOSKELETAL: Normal range of motion. No edema and no tenderness. 2+ distal pulses.  Labs: No results found for this or any previous visit (from the past 2 weeks).   Imaging Studies: No results found.  Assessment:     48 y.o. [id number] with ASCUS/HPV 16+ on pap 11/18/23, unable to perform adequate colposcopy due to severe cervical stenosis. Discussed in-office vs same-day surgery for a cone biopsy and pt desires same-day surgery.       Plan:   Counseling: Procedure, risks, reasons, benefits and complications (including injury to uterus, major blood vessel, bleeding, possibility of transfusion, infection, or inadequate sampling) reviewed in detail. Likelihood of success in alleviating the patient's condition was discussed. Routine postoperative instructions will be reviewed with the patient and her family in detail after surgery.  The patient concurred with the proposed plan, giving informed written consent for the surgery.   Pt requesting 01/10/24 Preop testing ordered. Instructions reviewed, including NPO after midnight.   Estil Mangle, DO [facility]

## 2023-12-28 ENCOUNTER — Encounter: Payer: Self-pay | Admitting: Obstetrics

## 2023-12-28 ENCOUNTER — Ambulatory Visit (INDEPENDENT_AMBULATORY_CARE_PROVIDER_SITE_OTHER): Admitting: Obstetrics

## 2023-12-28 VITALS — BP 119/81 | HR 71 | Wt 131.3 lb

## 2023-12-28 DIAGNOSIS — N882 Stricture and stenosis of cervix uteri: Secondary | ICD-10-CM

## 2023-12-28 DIAGNOSIS — Z01818 Encounter for other preprocedural examination: Secondary | ICD-10-CM

## 2023-12-28 DIAGNOSIS — R87610 Atypical squamous cells of undetermined significance on cytologic smear of cervix (ASC-US): Secondary | ICD-10-CM

## 2023-12-28 DIAGNOSIS — R87810 Cervical high risk human papillomavirus (HPV) DNA test positive: Secondary | ICD-10-CM | POA: Diagnosis not present

## 2023-12-28 MED ORDER — ESTRADIOL 0.1 MG/GM VA CREA: 1.0000 | TOPICAL_CREAM | Freq: Every day | VAGINAL | 1 refills | Status: AC

## 2024-01-03 ENCOUNTER — Other Ambulatory Visit: Payer: Self-pay

## 2024-01-03 ENCOUNTER — Encounter
Admission: RE | Admit: 2024-01-03 | Discharge: 2024-01-03 | Disposition: A | Discharge status: Home / Self Care | Source: Ambulatory Visit | Attending: Obstetrics | Admitting: Obstetrics

## 2024-01-03 HISTORY — DX: Hyperlipidemia, unspecified: E78.5

## 2024-01-03 NOTE — Patient Instructions (Addendum)
 Your procedure is scheduled on: MONDAY 01/10/24  Report to the Registration Desk on the 1st floor of the [facility]. To find out your arrival time, please call [phone] between 1PM - 3PM on: FRIDAY 01/07/24  If your arrival time is 6:00 am, do not arrive before that time as the [facility] entrance doors do not open until 6:00 am.  REMEMBER: Instructions that are not followed completely may result in serious medical risk, up to and including death; or upon the discretion of your surgeon and anesthesiologist your surgery may need to be rescheduled.  Do not eat food after midnight the night before surgery.  No gum chewing or hard candies.  One week prior to surgery: Stop Anti-inflammatories (NSAIDS) such as Advil , Aleve, Ibuprofen , Motrin , Naproxen, Naprosyn and Aspirin based products such as Excedrin, Goody's Powder, BC Powder.  Stop ANY OVER THE COUNTER supplements until after surgery.   You may however, continue to take Tylenol if needed for pain up until the day of surgery.   Continue taking all of your other prescription medications up until the day of surgery.  ON THE DAY OF SURGERY ONLY TAKE THESE MEDICATIONS WITH SIPS OF WATER:  NONE   No Alcohol for 24 hours before or after surgery.  No Smoking including e-cigarettes for 24 hours before surgery.  No chewable tobacco products for at least 6 hours before surgery.  No nicotine patches on the day of surgery.  Do not use any recreational drugs for at least a week (preferably 2 weeks) before your surgery.  Please be advised that the combination of cocaine and anesthesia may have negative outcomes, up to and including death. If you test positive for cocaine, your surgery will be cancelled.  On the morning of surgery brush your teeth with toothpaste and water, you may rinse your mouth with mouthwash if you wish. Do not swallow any toothpaste or mouthwash.   Do not wear jewelry, make-up, hairpins, clips or nail  polish.  For welded (permanent) jewelry: bracelets, anklets, waist bands, etc.  Please have this removed prior to surgery.  If it is not removed, there is a chance that hospital personnel will need to cut it off on the day of surgery.  Do not wear lotions, powders, or perfumes.   Do not shave body hair from the neck down 48 hours before surgery.  Contact lenses, hearing aids and dentures may not be worn into surgery.  Do not bring valuables to the hospital. [facility] is not responsible for any missing/lost belongings or valuables.   Bring your C-PAP to the hospital in case you may have to spend the night.   Notify your doctor if there is any change in your medical condition (cold, fever, infection).  Wear comfortable clothing (specific to your surgery type) to the hospital.  After surgery, you can help prevent lung complications by doing breathing exercises.  Take deep breaths and cough every 1-2 hours. Your doctor may order a device called an Incentive Spirometer to help you take deep breaths. When coughing or sneezing, hold a pillow firmly against your incision with both hands. This is called "splinting." Doing this helps protect your incision. It also decreases belly discomfort.  If you are being discharged the day of surgery, you will not be allowed to drive home. You will need a responsible individual to drive you home and stay with you for 24 hours after surgery.   If you are taking public transportation, you will need to have  a responsible individual with you.  Please call the Pre-admissions Testing Dept. at [phone] if you have any questions about these instructions.  Surgery Visitation Policy:    Patients having surgery or a procedure may have two visitors.  Children under the age of 15 must have an adult with them who is not the patient.  Merchandiser, retail to address health-related social needs:  [email or web address]

## 2024-01-10 ENCOUNTER — Encounter: Admission: RE | Payer: Self-pay | Source: Home / Self Care

## 2024-01-10 ENCOUNTER — Ambulatory Visit: Admission: RE | Admit: 2024-01-10 | Admitting: Obstetrics

## 2024-01-10 SURGERY — CONE BIOPSY, CERVIX
Anesthesia: Choice

## 2024-01-17 ENCOUNTER — Encounter: Payer: Self-pay | Admitting: Obstetrics

## 2024-01-17 ENCOUNTER — Ambulatory Visit: Admitting: General Practice

## 2024-01-17 ENCOUNTER — Ambulatory Visit
Admission: RE | Admit: 2024-01-17 | Discharge: 2024-01-17 | Disposition: A | Discharge status: Home / Self Care | Attending: Obstetrics | Admitting: Obstetrics

## 2024-01-17 ENCOUNTER — Other Ambulatory Visit: Payer: Self-pay

## 2024-01-17 ENCOUNTER — Encounter
Admission: RE | Disposition: A | Discharge status: Home / Self Care | Payer: Self-pay | Source: Home / Self Care | Attending: Obstetrics

## 2024-01-17 DIAGNOSIS — M4802 Spinal stenosis, cervical region: Secondary | ICD-10-CM | POA: Insufficient documentation

## 2024-01-17 DIAGNOSIS — B977 Papillomavirus as the cause of diseases classified elsewhere: Secondary | ICD-10-CM | POA: Insufficient documentation

## 2024-01-17 DIAGNOSIS — E7800 Pure hypercholesterolemia, unspecified: Secondary | ICD-10-CM | POA: Diagnosis not present

## 2024-01-17 DIAGNOSIS — R87610 Atypical squamous cells of undetermined significance on cytologic smear of cervix (ASC-US): Secondary | ICD-10-CM | POA: Diagnosis present

## 2024-01-17 DIAGNOSIS — E785 Hyperlipidemia, unspecified: Secondary | ICD-10-CM | POA: Diagnosis not present

## 2024-01-17 DIAGNOSIS — R87820 Cervical low risk human papillomavirus (HPV) DNA test positive: Secondary | ICD-10-CM | POA: Diagnosis not present

## 2024-01-17 DIAGNOSIS — Z780 Asymptomatic menopausal state: Secondary | ICD-10-CM | POA: Insufficient documentation

## 2024-01-17 DIAGNOSIS — N882 Stricture and stenosis of cervix uteri: Secondary | ICD-10-CM | POA: Diagnosis not present

## 2024-01-17 DIAGNOSIS — N72 Inflammatory disease of cervix uteri: Secondary | ICD-10-CM | POA: Insufficient documentation

## 2024-01-17 DIAGNOSIS — N870 Mild cervical dysplasia: Secondary | ICD-10-CM | POA: Insufficient documentation

## 2024-01-17 DIAGNOSIS — N888 Other specified noninflammatory disorders of cervix uteri: Secondary | ICD-10-CM | POA: Diagnosis not present

## 2024-01-17 DIAGNOSIS — R87810 Cervical high risk human papillomavirus (HPV) DNA test positive: Secondary | ICD-10-CM | POA: Diagnosis not present

## 2024-01-17 HISTORY — PX: CERVICAL CONIZATION W/BX: SHX1330

## 2024-01-17 SURGERY — CONE BIOPSY, CERVIX
Anesthesia: General | Site: Vagina

## 2024-01-17 MED ORDER — MIDAZOLAM HCL (PF) 2 MG/2ML IJ SOLN
INTRAMUSCULAR | Status: DC | PRN
  Administered 2024-01-17: 2 mg via INTRAVENOUS

## 2024-01-17 MED ORDER — IODINE STRONG (LUGOLS) 5 % PO SOLN
ORAL | Status: AC
  Filled 2024-01-17: qty 1

## 2024-01-17 MED ORDER — LACTATED RINGERS IV SOLN
INTRAVENOUS | Status: DC
Start: 2024-01-17 — End: 2024-01-17

## 2024-01-17 MED ORDER — OXYCODONE-ACETAMINOPHEN 5-325 MG PO TABS: 1.0000 | ORAL_TABLET | Freq: Three times a day (TID) | ORAL | 0 refills | Status: AC | PRN

## 2024-01-17 MED ORDER — IODINE STRONG (LUGOLS) 5 % PO SOLN
ORAL | Status: DC | PRN
  Administered 2024-01-17: 14 mL

## 2024-01-17 MED ORDER — MIDAZOLAM HCL 2 MG/2ML IJ SOLN
INTRAMUSCULAR | Status: AC
  Filled 2024-01-17: qty 2

## 2024-01-17 MED ORDER — OXYCODONE HCL 5 MG/5ML PO SOLN: 5.0000 mg | Freq: Once | ORAL | Status: DC | PRN

## 2024-01-17 MED ORDER — DROPERIDOL 2.5 MG/ML IJ SOLN
INTRAMUSCULAR | Status: AC
  Filled 2024-01-17: qty 2

## 2024-01-17 MED ORDER — FENTANYL CITRATE (PF) 250 MCG/5ML IJ SOLN
INTRAMUSCULAR | Status: AC
  Filled 2024-01-17: qty 5

## 2024-01-17 MED ORDER — ONDANSETRON HCL 4 MG/2ML IJ SOLN
INTRAMUSCULAR | Status: DC | PRN
  Administered 2024-01-17: 4 mg via INTRAVENOUS

## 2024-01-17 MED ORDER — ORAL CARE MOUTH RINSE: 15.0000 mL | Freq: Once | OROMUCOSAL | Status: AC

## 2024-01-17 MED ORDER — CHLORHEXIDINE GLUCONATE 0.12 % MT SOLN
15.0000 mL | Freq: Once | OROMUCOSAL | Status: AC
  Administered 2024-01-17: 15 mL via OROMUCOSAL

## 2024-01-17 MED ORDER — OXYCODONE HCL 5 MG PO TABS: 5.0000 mg | ORAL_TABLET | Freq: Once | ORAL | Status: DC | PRN

## 2024-01-17 MED ORDER — VASOPRESSIN 20 UNIT/ML IV SOLN
INTRAVENOUS | Status: AC
  Filled 2024-01-17: qty 1

## 2024-01-17 MED ORDER — LIDOCAINE HCL (PF) 2 % IJ SOLN
INTRAMUSCULAR | Status: AC
  Filled 2024-01-17: qty 5

## 2024-01-17 MED ORDER — KETOROLAC TROMETHAMINE 30 MG/ML IJ SOLN
INTRAMUSCULAR | Status: DC | PRN
  Administered 2024-01-17: 30 mg via INTRAVENOUS

## 2024-01-17 MED ORDER — SODIUM CHLORIDE (PF) 0.9 % IJ SOLN
INTRAMUSCULAR | Status: AC
  Filled 2024-01-17: qty 50

## 2024-01-17 MED ORDER — VASOPRESSIN 20 UNIT/ML IV SOLN
INTRAVENOUS | Status: DC | PRN
  Administered 2024-01-17: 10 mL

## 2024-01-17 MED ORDER — KETOROLAC TROMETHAMINE 30 MG/ML IJ SOLN
INTRAMUSCULAR | Status: AC
  Filled 2024-01-17: qty 1

## 2024-01-17 MED ORDER — LACTATED RINGERS IV SOLN: INTRAVENOUS | Status: DC

## 2024-01-17 MED ORDER — LIDOCAINE HCL (PF) 2 % IJ SOLN
INTRAMUSCULAR | Status: DC | PRN
  Administered 2024-01-17: 60 mg via INTRADERMAL

## 2024-01-17 MED ORDER — FENTANYL CITRATE (PF) 100 MCG/2ML IJ SOLN
INTRAMUSCULAR | Status: DC | PRN
  Administered 2024-01-17: 50 ug via INTRAVENOUS

## 2024-01-17 MED ORDER — PROPOFOL 1000 MG/100ML IV EMUL
INTRAVENOUS | Status: AC
  Filled 2024-01-17: qty 100

## 2024-01-17 MED ORDER — PROPOFOL 10 MG/ML IV BOLUS
INTRAVENOUS | Status: AC
  Filled 2024-01-17: qty 20

## 2024-01-17 MED ORDER — DROPERIDOL 2.5 MG/ML IJ SOLN
0.6250 mg | Freq: Once | INTRAMUSCULAR | Status: AC
  Administered 2024-01-17: 0.625 mg via INTRAVENOUS

## 2024-01-17 MED ORDER — MONSELS FERRIC SUBSULFATE EX SOLN
CUTANEOUS | Status: AC
  Filled 2024-01-17: qty 8

## 2024-01-17 MED ORDER — FENTANYL CITRATE (PF) 100 MCG/2ML IJ SOLN: 25.0000 ug | INTRAMUSCULAR | Status: DC | PRN

## 2024-01-17 MED ORDER — ONDANSETRON HCL 4 MG/2ML IJ SOLN
INTRAMUSCULAR | Status: AC
  Filled 2024-01-17: qty 2

## 2024-01-17 MED ORDER — PROPOFOL 500 MG/50ML IV EMUL
INTRAVENOUS | Status: DC | PRN
  Administered 2024-01-17: 125 ug/kg/min via INTRAVENOUS

## 2024-01-17 MED ORDER — DEXAMETHASONE SOD PHOSPHATE PF 10 MG/ML IJ SOLN
INTRAMUSCULAR | Status: DC | PRN
  Administered 2024-01-17: 4 mg via INTRAVENOUS

## 2024-01-17 MED ORDER — MONSELS FERRIC SUBSULFATE EX SOLN
CUTANEOUS | Status: DC | PRN
  Administered 2024-01-17: 1 via TOPICAL

## 2024-01-17 MED ORDER — CHLORHEXIDINE GLUCONATE 0.12 % MT SOLN
OROMUCOSAL | Status: AC
  Filled 2024-01-17: qty 15

## 2024-01-17 MED ORDER — PROPOFOL 10 MG/ML IV BOLUS
INTRAVENOUS | Status: DC | PRN
  Administered 2024-01-17: 50 mg via INTRAVENOUS

## 2024-01-17 SURGICAL SUPPLY — 30 items
BLADE SURG 15 STRL LF DISP TIS (BLADE) ×2 IMPLANT
BLADE SURG SZ11 CARB STEEL (BLADE) ×1 IMPLANT
COUNTER NDL MAGNETIC 40 RED (SET/KITS/TRAYS/PACK) ×1 IMPLANT
COUNTER NEEDLE MAGNETIC 40 RED (SET/KITS/TRAYS/PACK) ×1 IMPLANT
DRAPE UNDER BUTTOCK W/FLU (DRAPES) ×1 IMPLANT
ELECT BLADE 6.5 EXT (BLADE) ×1 IMPLANT
ELECTRODE LEEP BALL 5MM 12CM (MISCELLANEOUS) IMPLANT
ELECTRODE REM PT RTRN 9FT ADLT (ELECTROSURGICAL) ×1 IMPLANT
GLOVE BIO SURGEON STRL SZ 6 (GLOVE) ×1 IMPLANT
GLOVE BIOGEL PI IND STRL 6 (GLOVE) ×1 IMPLANT
GOWN STRL REUS W/ TWL LRG LVL3 (GOWN DISPOSABLE) ×2 IMPLANT
HANDLE YANKAUER SUCT BULB TIP (MISCELLANEOUS) ×1 IMPLANT
KIT TURNOVER CYSTO (KITS) ×1 IMPLANT
LABEL OR SOLS (LABEL) ×1 IMPLANT
MANIFOLD NEPTUNE II (INSTRUMENTS) ×1 IMPLANT
NDL SPNL 22GX3.5 QUINCKE BK (NEEDLE) IMPLANT
NEEDLE SPNL 22GX3.5 QUINCKE BK (NEEDLE) IMPLANT
NS IRRIG 500ML POUR BTL (IV SOLUTION) ×1 IMPLANT
PACK DNC HYST (MISCELLANEOUS) ×1 IMPLANT
PAD PREP OB/GYN DISP 24X41 (PERSONAL CARE ITEMS) ×1 IMPLANT
PENCIL SMOKE EVACUATOR (MISCELLANEOUS) ×1 IMPLANT
SCOPETTES 8 STERILE (MISCELLANEOUS) ×1 IMPLANT
SCRUB CHG 4% DYNA-HEX 4OZ (MISCELLANEOUS) ×1 IMPLANT
SUT SILK 2 0 SH (SUTURE) ×1 IMPLANT
SUT VICRYL+ 3-0 36IN CT-1 (SUTURE) ×1 IMPLANT
SYR 10ML LL (SYRINGE) ×1 IMPLANT
SYR CONTROL 10ML LL (SYRINGE) IMPLANT
TRAP FLUID SMOKE EVACUATOR (MISCELLANEOUS) ×1 IMPLANT
TUBING CONNECTING 10 (TUBING) IMPLANT
WATER STERILE IRR 500ML POUR (IV SOLUTION) ×1 IMPLANT

## 2024-01-17 NOTE — Transfer of Care (Signed)
 Immediate Anesthesia Transfer of Care Note  Patient: Kelly Humphrey  Procedure(s) Performed: CONE BIOPSY, CERVIX (Vagina )  Patient Location: PACU  Anesthesia Type:General  Level of Consciousness: awake  Airway & Oxygen Therapy: Patient Spontanous Breathing  Post-op Assessment: Report given to RN and Post -op Vital signs reviewed and stable  Post vital signs: Reviewed and stable  Last Vitals:  Vitals Value Taken Time  BP 104/69 01/17/24 10:50  Temp    Pulse 68 01/17/24 10:53  Resp 13 01/17/24 10:53  SpO2 99 % 01/17/24 10:53  Vitals shown include unfiled device data.  Last Pain:  Vitals:   01/17/24 0905  TempSrc: Temporal  PainSc: 0-No pain         Complications: No notable events documented.

## 2024-01-17 NOTE — Op Note (Signed)
 OPERATIVE NOTE  DATE: 01/17/2024  PRE-OPERATIVE DIAGNOSIS:  1) abnormal pap ASCUS, HPV Positive 16, cervical stenosis, inadequate colposcopy  POST-OPERATIVE DIAGNOSIS:  Post-Op Diagnosis Codes:    * ASCUS with positive high risk HPV cervical [R87.610, R87.810]    * Cervical stenosis (uterine cervix) [N88.2]  OPERATION:  Cold knife cone biopsy  SURGEON(S): Surgeons and Role:    * Leigh Sober, MD - Primary   ANESTHESIA: Monitor Anesthesia Care  ESTIMATED BLOOD LOSS: 1 mL  URINE OUTPUT: 50cc  OPERATIVE FINDINGS: Cervical stenosis, no areas of decreased Lugol's uptake  SPECIMEN:  ID Type Source Tests Collected by Time Destination  1 : Endocervical Currettings Tissue PATH Gyn biopsy SURGICAL PATHOLOGY Leigh Sober, MD 01/17/2024 1036   2 : Cone Biopsy: Stitch at 12 o'clock Tissue PATH Gyn biopsy SURGICAL PATHOLOGY Leigh Sober, MD 01/17/2024 1038     COMPLICATIONS: None  DRAINS: None  DISPOSITION: Stable to recovery room  DESCRIPTION OF PROCEDURE:      The patient was taken to the operating room, prepped and draped in the dorsal lithotomy position and placed under general anesthesia. An examination under anesthesia was performed. Her bladder was emptied. A weighted speculum was placed into the vagina. The cervix was grasped with a single toothed tenaculum. Vasopressin was injected circumferentially around the cervix, approximately 10cc of the solution was utilized. The cervix was grasped with an Allis clamp and using an 11 scalpel blade, a cone biopsy was resected in a clockwise motion. The base was cut with mayo scissors. The specimen was tagged at 38 o' clock with suture. Endocervical curettage was performed. The biopsy bed was then cauterized with the ball on 60 watts, good hemostasis achieved, and Monsel's was placed on the wound, hemostasis was noted. The speculum was removed and the patient went to recovery room in stable condition.   Sober Leigh, DO  [facility]

## 2024-01-17 NOTE — Anesthesia Preprocedure Evaluation (Signed)
 Anesthesia Evaluation  Patient identified by MRN, date of birth, ID band Patient awake    Reviewed: Allergy & Precautions, NPO status , Patient's Chart, lab work & pertinent test results  Airway Mallampati: III  TM Distance: >3 FB Neck ROM: full    Dental  (+) Chipped, Dental Advidsory Given   Pulmonary neg pulmonary ROS   Pulmonary exam normal        Cardiovascular negative cardio ROS Normal cardiovascular exam     Neuro/Psych negative neurological ROS  negative psych ROS   GI/Hepatic negative GI ROS, Neg liver ROS,,,  Endo/Other  negative endocrine ROS    Renal/GU negative Renal ROS  negative genitourinary   Musculoskeletal   Abdominal   Peds  Hematology negative hematology ROS (+)   Anesthesia Other Findings Past Medical History: No date: Hyperlipidemia  Past Surgical History: 07/31/2022: COLONOSCOPY WITH PROPOFOL ; N/A     Comment:  Procedure: COLONOSCOPY WITH PROPOFOL ;  Surgeon: Therisa Bi, MD;  Location: [facility] ENDOSCOPY;  Service:               Gastroenterology;  Laterality: N/A; No date: NO PAST SURGERIES  BMI    Body Mass Index: 27.17 kg/m      Reproductive/Obstetrics negative OB ROS                              Anesthesia Physical Anesthesia Plan  ASA: 2  Anesthesia Plan: General   Post-op Pain Management:    Induction: Intravenous  PONV Risk Score and Plan: 3 and Ondansetron, Dexamethasone, Propofol  infusion, TIVA and Midazolam  Airway Management Planned: Natural Airway and Nasal Cannula  Additional Equipment:   Intra-op Plan:   Post-operative Plan:   Informed Consent: I have reviewed the patients History and Physical, chart, labs and discussed the procedure including the risks, benefits and alternatives for the proposed anesthesia with the patient or authorized representative who has indicated his/her understanding and acceptance.      Dental Advisory Given  Plan Discussed with: Anesthesiologist, CRNA and Surgeon  Anesthesia Plan Comments: (Patient consented for risks of anesthesia including but not limited to:  - adverse reactions to medications - risk of airway placement if required - damage to eyes, teeth, lips or other oral mucosa - nerve damage due to positioning  - sore throat or hoarseness - Damage to heart, brain, nerves, lungs, other parts of body or loss of life  Patient voiced understanding and assent.)        Anesthesia Quick Evaluation

## 2024-01-17 NOTE — Anesthesia Postprocedure Evaluation (Signed)
 Anesthesia Post Note  Patient: Kaysen P [employer]  Procedure(s) Performed: CONE BIOPSY, CERVIX (Vagina )  Patient location during evaluation: Endoscopy Anesthesia Type: General Level of consciousness: awake and alert Pain management: pain level controlled Vital Signs Assessment: post-procedure vital signs reviewed and stable Respiratory status: spontaneous breathing, nonlabored ventilation, respiratory function stable and patient connected to nasal cannula oxygen Cardiovascular status: blood pressure returned to baseline and stable Postop Assessment: no apparent nausea or vomiting Anesthetic complications: no   No notable events documented.   Last Vitals:  Vitals:   01/17/24 1100 01/17/24 1119  BP: 112/83 (!) 142/76  Pulse:  (!) 58  Resp: 20 18  Temp:  (!) 36.2 C  SpO2: 100% 100%    Last Pain:  Vitals:   01/17/24 1119  TempSrc: Temporal  PainSc: 0-No pain                 Debby Mines

## 2024-01-17 NOTE — Interval H&P Note (Signed)
 History and Physical Interval Note:  01/17/2024 10:05 AM  Kelly Humphrey  has presented today for surgery, with the diagnosis of abnormal pap ASCUS, HPV Positive 16, cervical stenosis, inadequate colposcopy.  The various methods of treatment have been discussed with the patient and family. After consideration of risks, benefits and other options for treatment, the patient has consented to  Procedure(s) with comments: CONE BIOPSY, CERVIX (N/A) - Cold Knife as a surgical intervention.  The patient's history has been reviewed, patient examined, no change in status, stable for surgery.  I have reviewed the patient's chart and labs.  Questions were answered to the patient's satisfaction.     [name]

## 2024-01-18 ENCOUNTER — Encounter: Payer: Self-pay | Admitting: Obstetrics

## 2024-01-20 LAB — SURGICAL PATHOLOGY

## 2024-02-01 ENCOUNTER — Encounter: Admitting: Obstetrics

## 2024-02-10 NOTE — Progress Notes (Signed)
    OBSTETRICS/GYNECOLOGY POST-OPERATIVE CLINIC VISIT  Subjective:     Kelly Humphrey is a 48 y.o. female who presents to the clinic 4 weeks status post cold knife cone biopsy for ASCUS HPV 16+ pap and stenotic cervix making colposcopy inadequate.  Eating a regular diet without difficulty. Bowel movements are normal. The patient is not having any pain.  The following portions of the patient's history were reviewed and updated as appropriate: allergies, current medications, past family history, past medical history, past social history, past surgical history, and problem list.  Review of Systems Pertinent items are noted in HPI.   Objective:   BP 116/74   Pulse 74   Ht 4' 10 (1.473 m)   Wt 133 lb 11.2 oz (60.6 kg)   BMI 27.94 kg/m  Body mass index is 27.94 kg/m.  General:  alert and no distress  Abdomen: soft, bowel sounds active, non-tender  Pelvic:   External genitalia normal, vaginal mucosa normal, no discharge; cervix well-healed, new vasculature noted at internal os. No CMT or discharge.     Pathology:   1. Endocervix, curettage,  :       MUCOHEMORRHAGIC DEBRIS WITH ADMIXED BENIGN ENDOCERVICAL EPITHELIUM        2. 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 [id number] with inadequate colposcopy, s/p CKC, healing well.    Plan:   1. Continue any current medications as instructed by provider. 2. Wound care discussed. 3. Operative findings again reviewed. Pathology report discussed. 4. Activity restrictions: none 5. Anticipated return to work: not applicable. 6. Follow up: 9 months for repeat pap/annual exam, sooner prn   Estil Mangle DO [facility]

## 2024-02-15 ENCOUNTER — Ambulatory Visit: Admitting: Obstetrics

## 2024-02-15 ENCOUNTER — Encounter: Payer: Self-pay | Admitting: Obstetrics

## 2024-02-15 VITALS — BP 116/74 | HR 74 | Ht <= 58 in | Wt 133.7 lb

## 2024-02-15 DIAGNOSIS — Z4889 Encounter for other specified surgical aftercare: Secondary | ICD-10-CM

## 2024-02-15 DIAGNOSIS — N870 Mild cervical dysplasia: Secondary | ICD-10-CM

## 2024-05-05 IMAGING — MG MM DIGITAL SCREENING BILAT W/ TOMO AND CAD
6 of 10 series · 6 of 30 positions shown · non-contrast
Comparison: Previous exam(s).

CLINICAL DATA: Screening.

EXAM:
DIGITAL SCREENING BILATERAL MAMMOGRAM WITH TOMOSYNTHESIS AND CAD
TECHNIQUE: Bilateral screening digital craniocaudal and mediolateral oblique
mammograms were obtained. Bilateral screening digital breast
tomosynthesis was performed. The images were evaluated with
computer-aided detection.

[L MLO synth-2D]
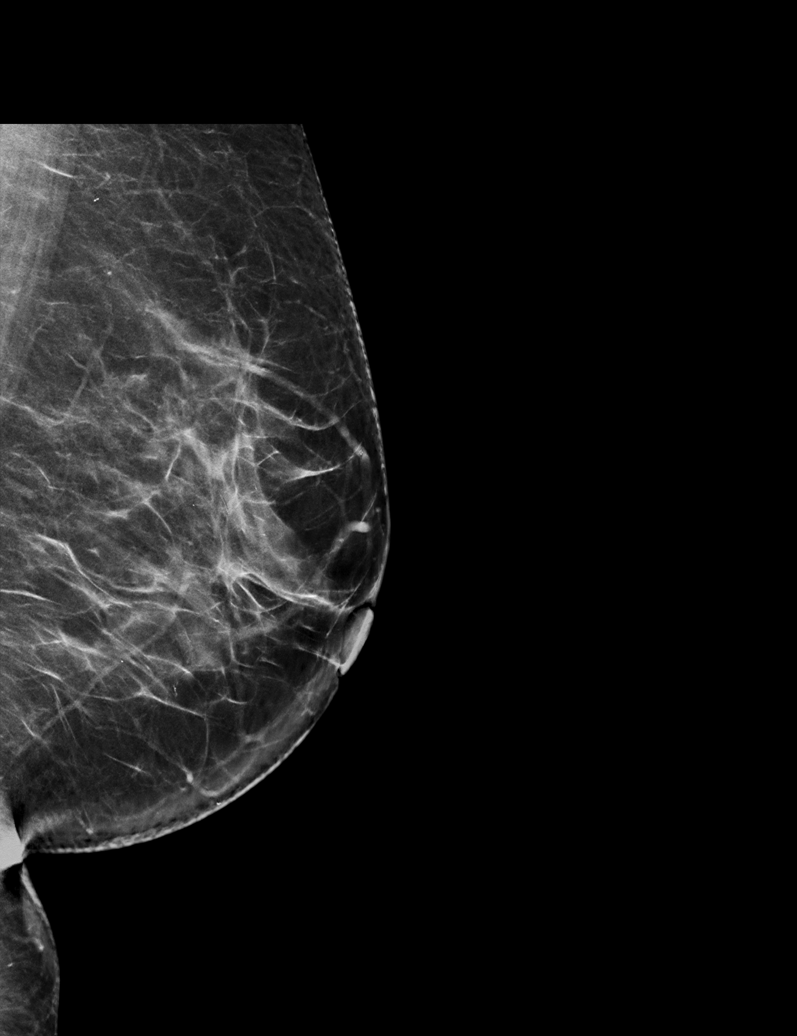

[R MLO synth-2D (1 of 2)]
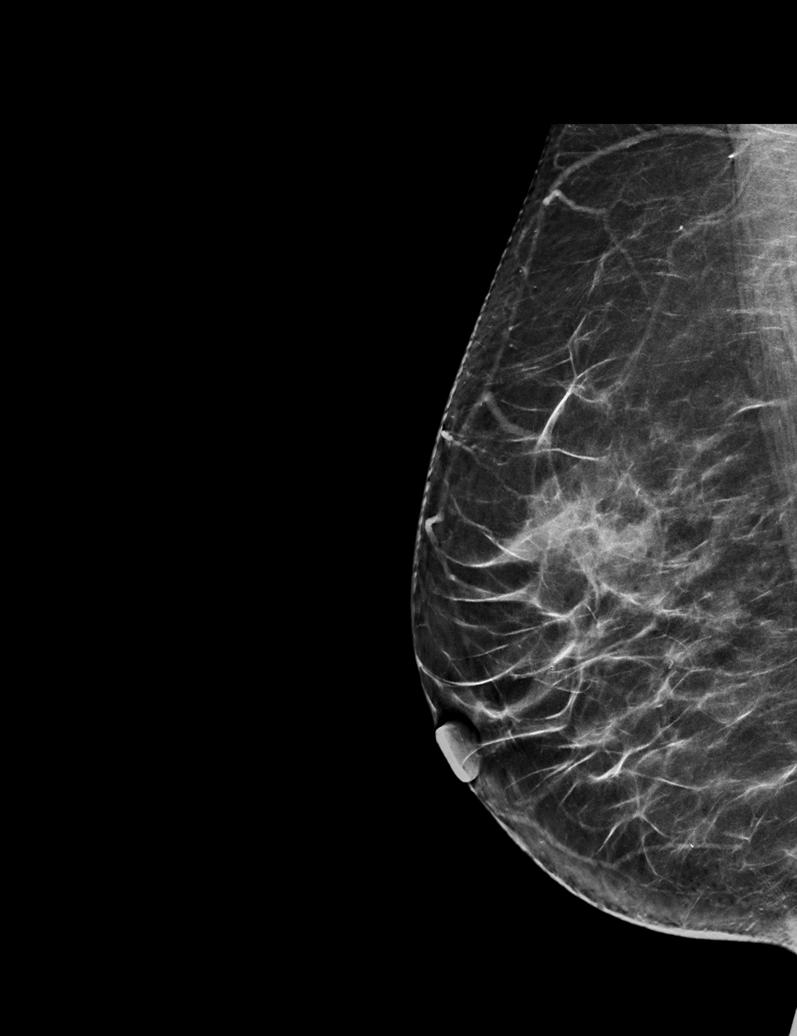

[L CC synth-2D]
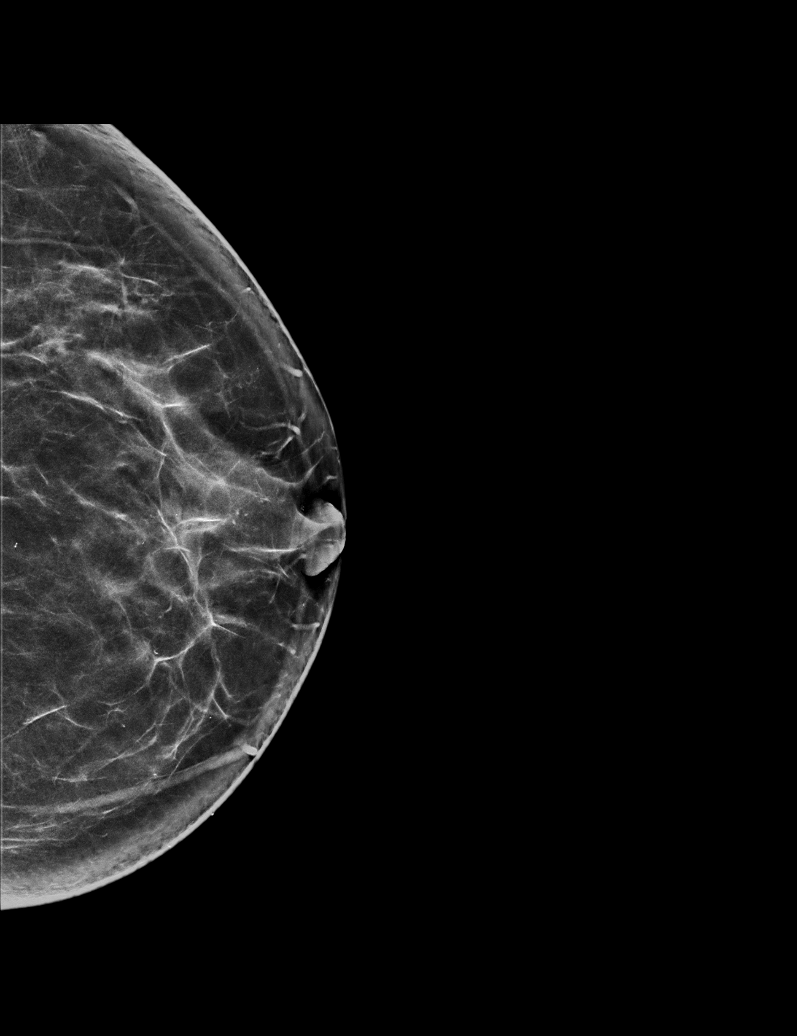

[R CC synth-2D]
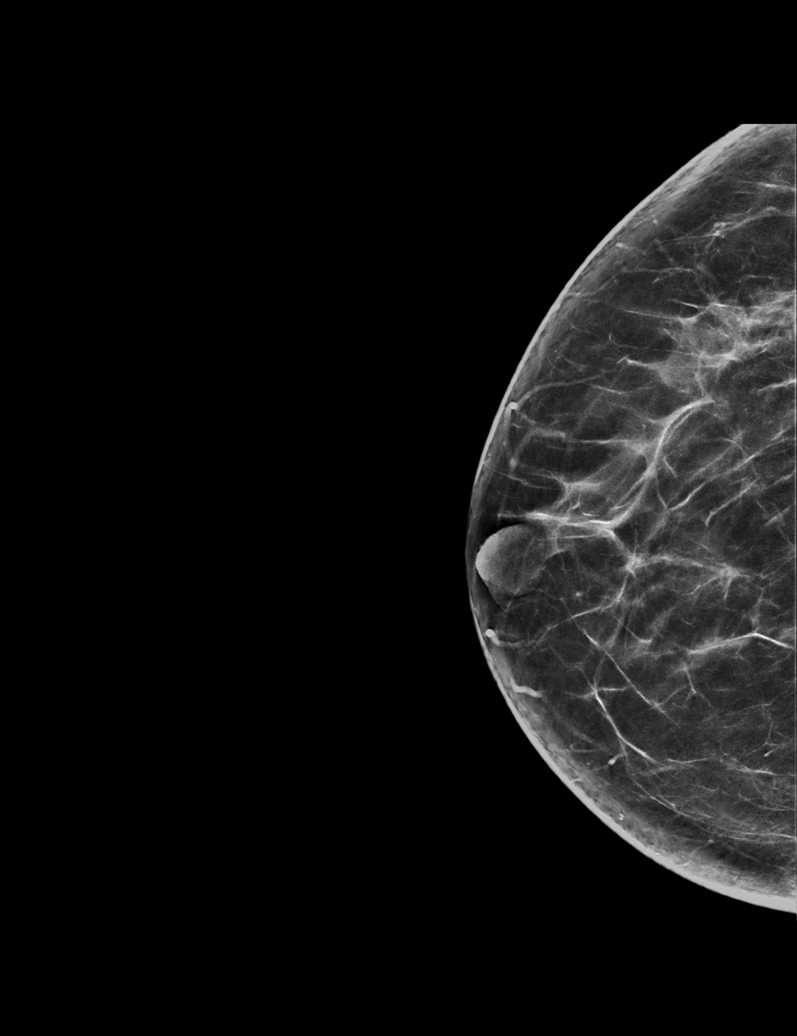

[R MLO synth-2D (2 of 2)]
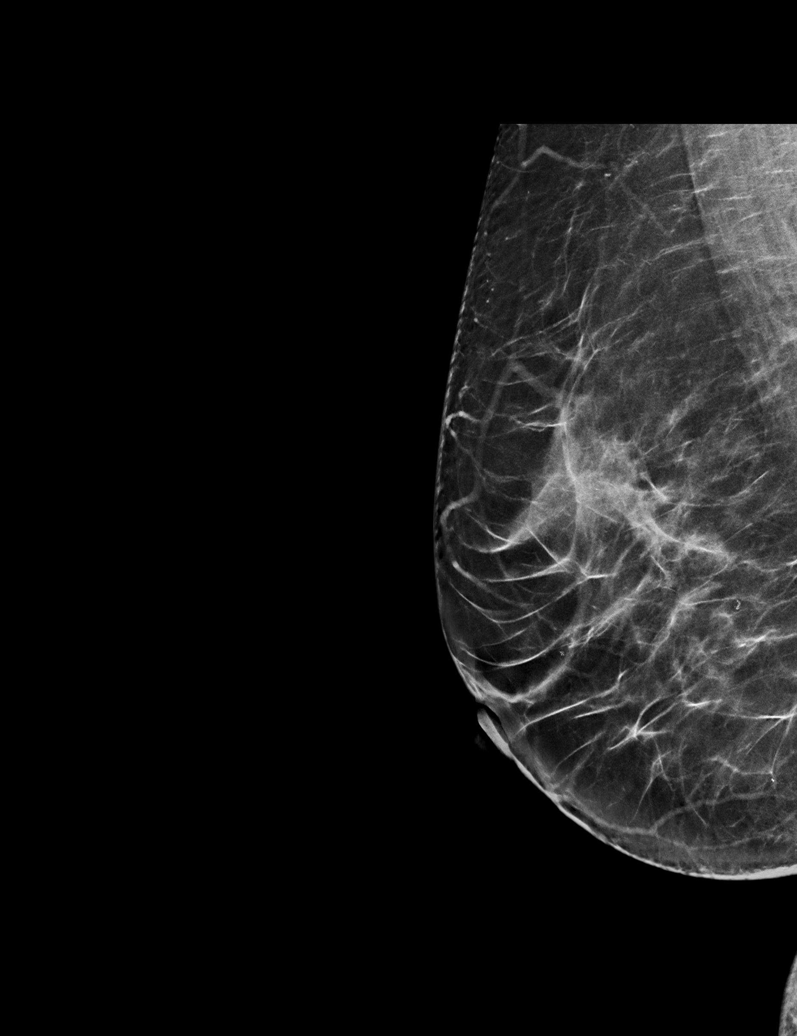

[R MLO tomo · tomo slice 34/67.0]
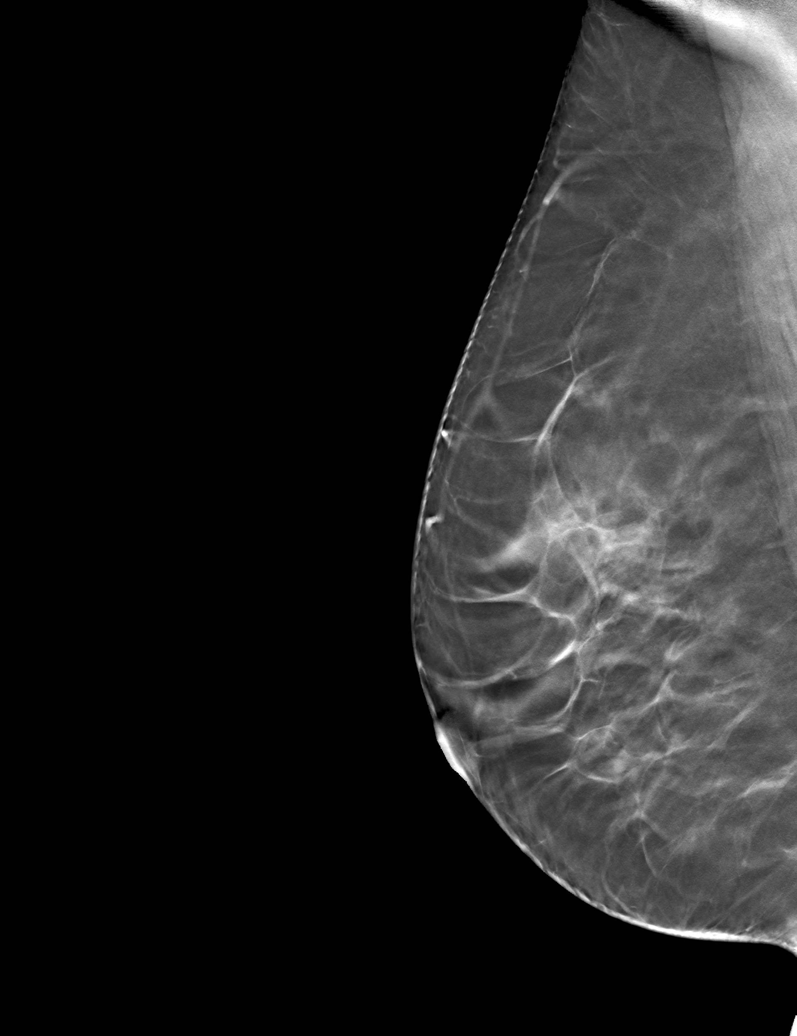

[6 of 30 positions shown; findings below may reference images not displayed]

ACR Breast Density Category c: The breast tissue is heterogeneously
dense, which may obscure small masses.
FINDINGS: There are no findings suspicious for malignancy.
IMPRESSION: No mammographic evidence of malignancy. A result letter of this
screening mammogram will be mailed directly to the patient.

RECOMMENDATION:
Screening mammogram in one year. (Code:Q3-W-BC3)

BI-RADS CATEGORY  1: Negative.
